# Patient Record
Sex: Female | Born: 1935 | Race: White | Hispanic: No | Smoking: Never smoker
Health system: Southern US, Community
[De-identification: ages and names within clinical notes are randomized; demographics above are authoritative.]

## PROBLEM LIST (undated history)

## (undated) DIAGNOSIS — M4800 Spinal stenosis, site unspecified: Secondary | ICD-10-CM

## (undated) DIAGNOSIS — M1990 Unspecified osteoarthritis, unspecified site: Secondary | ICD-10-CM

## (undated) DIAGNOSIS — F329 Major depressive disorder, single episode, unspecified: Secondary | ICD-10-CM

## (undated) DIAGNOSIS — I739 Peripheral vascular disease, unspecified: Secondary | ICD-10-CM

## (undated) DIAGNOSIS — J8410 Pulmonary fibrosis, unspecified: Secondary | ICD-10-CM

## (undated) DIAGNOSIS — I2510 Atherosclerotic heart disease of native coronary artery without angina pectoris: Principal | ICD-10-CM

## (undated) DIAGNOSIS — F32A Depression, unspecified: Secondary | ICD-10-CM

## (undated) DIAGNOSIS — Z9861 Coronary angioplasty status: Principal | ICD-10-CM

## (undated) DIAGNOSIS — R911 Solitary pulmonary nodule: Secondary | ICD-10-CM

## (undated) DIAGNOSIS — N183 Chronic kidney disease, stage 3 unspecified: Secondary | ICD-10-CM

## (undated) DIAGNOSIS — I10 Essential (primary) hypertension: Secondary | ICD-10-CM

## (undated) HISTORY — DX: Essential (primary) hypertension: I10

## (undated) HISTORY — DX: Peripheral vascular disease, unspecified: I73.9

## (undated) HISTORY — DX: Atherosclerotic heart disease of native coronary artery without angina pectoris: I25.10

## (undated) HISTORY — PX: BACK SURGERY: SHX140

## (undated) HISTORY — DX: Pulmonary fibrosis, unspecified: J84.10

## (undated) HISTORY — DX: Unspecified osteoarthritis, unspecified site: M19.90

## (undated) HISTORY — DX: Major depressive disorder, single episode, unspecified: F32.9

## (undated) HISTORY — PX: TUBAL LIGATION: SHX77

## (undated) HISTORY — DX: Depression, unspecified: F32.A

## (undated) HISTORY — DX: Spinal stenosis, site unspecified: M48.00

## (undated) HISTORY — DX: Coronary angioplasty status: Z98.61

## (undated) HISTORY — DX: Solitary pulmonary nodule: R91.1

## (undated) HISTORY — DX: Chronic kidney disease, stage 3 (moderate): N18.3

## (undated) HISTORY — PX: APPENDECTOMY: SHX54

## (undated) HISTORY — DX: Chronic kidney disease, stage 3 unspecified: N18.30

---

## 1999-12-22 ENCOUNTER — Encounter: Admission: RE | Admit: 1999-12-22 | Discharge: 1999-12-22 | Payer: Self-pay | Admitting: Family Medicine

## 1999-12-22 ENCOUNTER — Encounter: Payer: Self-pay | Admitting: Family Medicine

## 2001-05-11 ENCOUNTER — Encounter (INDEPENDENT_AMBULATORY_CARE_PROVIDER_SITE_OTHER): Payer: Self-pay | Admitting: Specialist

## 2001-05-11 ENCOUNTER — Ambulatory Visit (HOSPITAL_COMMUNITY): Admission: RE | Admit: 2001-05-11 | Discharge: 2001-05-11 | Payer: Self-pay | Admitting: Gastroenterology

## 2003-06-22 ENCOUNTER — Encounter: Admission: RE | Admit: 2003-06-22 | Discharge: 2003-06-22 | Payer: Self-pay | Admitting: Family Medicine

## 2008-08-02 ENCOUNTER — Encounter: Admission: RE | Admit: 2008-08-02 | Discharge: 2008-08-02 | Payer: Self-pay | Admitting: Family Medicine

## 2008-08-20 ENCOUNTER — Encounter: Admission: RE | Admit: 2008-08-20 | Discharge: 2008-08-20 | Payer: Self-pay | Admitting: Cardiology

## 2008-09-24 ENCOUNTER — Encounter: Admission: RE | Admit: 2008-09-24 | Discharge: 2008-09-24 | Payer: Self-pay | Admitting: Cardiology

## 2008-10-15 ENCOUNTER — Encounter: Admission: RE | Admit: 2008-10-15 | Discharge: 2008-10-15 | Payer: Self-pay | Admitting: Family Medicine

## 2009-04-02 ENCOUNTER — Encounter: Admission: RE | Admit: 2009-04-02 | Discharge: 2009-04-02 | Payer: Self-pay | Admitting: Family Medicine

## 2010-08-01 NOTE — Procedures (Signed)
[facility] [place]  Patient:    NOAM, FRANZEN Visit Number: [id number] MRN: [id number]          Service Type: END Location: ENDO Attending Physician:  Orvis Brill Dictated by:   Jeryl Columbia, M.D. Proc. Date: 05/11/01 Admit Date:  05/11/2001   CC:         Lindwood Qua, M.D.   Procedure Report  PROCEDURE:  Colonoscopy with biopsy.  INDICATIONS FOR PROCEDURE:  A patient with a questionable history of colon polyps, family history of colon polyps, constipation, lower abdominal pain due for colonic screening.  Consent was signed after risks, benefits, methods, and options were thoroughly discussed in the office.  MEDICINES USED:  Demerol 100, Versed 11.  DESCRIPTION OF PROCEDURE:  Rectal inspection is pertinent for external hemorrhoids, small. Digital exam was negative. The pediatric video adjustable colonoscope was inserted and with some difficulty due to a tortuous sigmoid we were able to advance to the cecum. This did require rolling her on her back and then on her right side and various abdominal pressures. Other than a left sided diverticula, no other abnormality was seen on insertion. The prep was adequate. There was some liquid stool that required washing and suctioning. The scope was inserted a short ways into the terminal ileum which was normal. Photo documentation was obtained. The scope was slowly withdrawn. On slow withdrawal through the colon, she did have a tortuous colon. When we fell back around a tortuous curve, we tried to readvance around the curve to decrease the chances of missing things. There was a small sessile ascending polyp along a fold which we went ahead and cold biopsied x 3. The scope was further withdrawn. No other polypoid lesions or masses were seen as we slowly withdrew back to the rectum. We did see some significant left sided diverticula and some tortuosity and wall edema particularly in the sigmoid. No  other abnormalities were seen. Once back in the rectum, the scope was retroflexed pertinent for some small internal hemorrhoids. The scope was straightened, air was suctioned, the scope removed. The patient tolerated the procedure fair. There was no obvious or immediate complication.  ENDOSCOPIC DIAGNOSIS:  1. Internal/external hemorrhoids.  2. Left sided diverticula and tortuosity.  3. Small ascending sessile polyp cold biopsied.  4. Otherwise within normal limits to the terminal ileum.  PLAN:  Await pathology to determine future colonic screening. Happy to see back p.r.n. or in two to three months to recheck symptoms and make sure no further workup plans are needed but would consider a CAT scan next. Possibly her lower pain is just due to adhesions which played a role with her discomfort in this procedure I believe. In the future if colonoscopy is needed probably would recommend diprivan. Dictated by:   Jeryl Columbia, M.D. Attending Physician:  Orvis Brill DD:  05/11/01 TD:  05/11/01 Job: [phone] [id number]

## 2010-08-05 ENCOUNTER — Other Ambulatory Visit: Payer: Self-pay | Admitting: Family Medicine

## 2010-08-05 DIAGNOSIS — IMO0002 Reserved for concepts with insufficient information to code with codable children: Secondary | ICD-10-CM

## 2010-08-09 ENCOUNTER — Ambulatory Visit
Admission: RE | Admit: 2010-08-09 | Discharge: 2010-08-09 | Disposition: A | Discharge status: Home / Self Care | Payer: BLUE CROSS/BLUE SHIELD | Source: Ambulatory Visit | Attending: Family Medicine | Admitting: Family Medicine

## 2010-08-09 DIAGNOSIS — IMO0002 Reserved for concepts with insufficient information to code with codable children: Secondary | ICD-10-CM

## 2012-03-21 ENCOUNTER — Other Ambulatory Visit: Payer: Self-pay | Admitting: Family Medicine

## 2012-03-21 DIAGNOSIS — J984 Other disorders of lung: Secondary | ICD-10-CM

## 2012-03-24 ENCOUNTER — Inpatient Hospital Stay: Admission: RE | Admit: 2012-03-24 | Payer: BLUE CROSS/BLUE SHIELD | Source: Ambulatory Visit

## 2012-03-29 ENCOUNTER — Ambulatory Visit
Admission: RE | Admit: 2012-03-29 | Discharge: 2012-03-29 | Disposition: A | Discharge status: Home / Self Care | Payer: Medicare Other | Source: Ambulatory Visit | Attending: Family Medicine | Admitting: Family Medicine

## 2012-03-29 DIAGNOSIS — J984 Other disorders of lung: Secondary | ICD-10-CM

## 2012-03-29 MED ORDER — IOHEXOL 300 MG/ML  SOLN
75.0000 mL | Freq: Once | INTRAMUSCULAR | Status: AC | PRN
  Administered 2012-03-29: 75 mL via INTRAVENOUS

## 2013-05-17 ENCOUNTER — Other Ambulatory Visit: Payer: Self-pay | Admitting: Family Medicine

## 2013-05-17 ENCOUNTER — Ambulatory Visit
Admission: RE | Admit: 2013-05-17 | Discharge: 2013-05-17 | Disposition: A | Discharge status: Home / Self Care | Payer: Medicare Other | Source: Ambulatory Visit | Attending: Family Medicine | Admitting: Family Medicine

## 2013-05-17 DIAGNOSIS — R0600 Dyspnea, unspecified: Secondary | ICD-10-CM

## 2013-11-29 ENCOUNTER — Encounter (INDEPENDENT_AMBULATORY_CARE_PROVIDER_SITE_OTHER): Payer: Self-pay

## 2013-11-29 ENCOUNTER — Encounter: Payer: Self-pay | Admitting: Emergency Medicine

## 2013-11-29 ENCOUNTER — Ambulatory Visit (INDEPENDENT_AMBULATORY_CARE_PROVIDER_SITE_OTHER): Payer: Medicare Other | Admitting: Emergency Medicine

## 2013-11-29 ENCOUNTER — Other Ambulatory Visit: Payer: Medicare Other

## 2013-11-29 VITALS — BP 128/70 | HR 98 | Temp 97.4°F | Ht 61.0 in | Wt 174.4 lb

## 2013-11-29 DIAGNOSIS — R0902 Hypoxemia: Secondary | ICD-10-CM | POA: Insufficient documentation

## 2013-11-29 DIAGNOSIS — J849 Interstitial pulmonary disease, unspecified: Secondary | ICD-10-CM

## 2013-11-29 DIAGNOSIS — J8410 Pulmonary fibrosis, unspecified: Secondary | ICD-10-CM

## 2013-11-29 NOTE — Assessment & Plan Note (Signed)
Etiology of her hypoxemia and her progressive dyspnea is not entirely clear. She does have interstitial lung disease but it has not changed significantly compared with 2011 by CT scan. She does have more kyphosis now which could contribute to restrictive lung disease. If she has been hypoxemic chronically and she may have secondary pulmonary hypertension. She describes chest pain with exertion and tells me that this has been worked up before. I don't see any records - echocardiogram to evaluate pulmonary pressures and LV function - Consider a full cardiology eval

## 2013-11-29 NOTE — Progress Notes (Signed)
Subjective:    Patient ID: Christine Pratt, female    DOB: 1925/09/18, 80 y.o.   MRN: [id number]  HPI 80 yo never smoker followed by Dr Brigitte Pulse for HTN, OA, CKD. Carries a dx of ILD in a UIP pattern with some mediastinal LAD first noted on a CT scan performed 10/15/08 to eval a RML pleural based nodule. The nodule has been stable on subsequent scans, last 03/2012. There may be some progression of her ILD but only slight. Has tried Advair without effect in the past. She was just started on O2, was given symbicort but hasn;t started it yet. Denies any cough. She does not have an aspiration sx.  She has exertional CP that gets better when she stops to rest.   She had normal spirometry 09/28/12.    Review of Systems  Constitutional: Negative for fever and unexpected weight change.  HENT: Positive for rhinorrhea. Negative for congestion, dental problem, ear pain, nosebleeds, postnasal drip, sinus pressure, sneezing, sore throat and trouble swallowing.   Eyes: Negative for redness and itching.  Respiratory: Positive for cough, shortness of breath and wheezing. Negative for chest tightness.   Cardiovascular: Positive for leg swelling. Negative for palpitations.  Gastrointestinal: Negative for nausea and vomiting.  Genitourinary: Negative for dysuria.  Musculoskeletal: Positive for joint swelling.  Skin: Negative for rash.  Neurological: Negative for headaches.  Hematological: Does not bruise/bleed easily.  Psychiatric/Behavioral: Negative for dysphoric mood. The patient is not nervous/anxious.       Past Medical History  Diagnosis Date  . Hypertension   . Arthritis   . Depression   . Pulmonary nodule   . DJD (degenerative joint disease)   . Spinal stenosis   . CKD (chronic kidney disease), stage III   . Pulmonary fibrosis      Family History  Problem Relation Age of Onset  . Heart failure Sister   . Cancer Mother     kidney  . Heart attack Father      History   Social History  .  Marital Status: Widowed    Spouse Name: N/A    Number of Children: N/A  . Years of Education: N/A   Occupational History  . Retired    Social History Main Topics  . Smoking status: Never Smoker   . Smokeless tobacco: Never Used  . Alcohol Use: No  . Drug Use: No  . Sexual Activity: Not on file   Other Topics Concern  . Not on file   Social History Narrative  . No narrative on file  No dust or fume exposure. Has worked as a Network engineer, as a Building control surveyor.  Unsure about mold exposure in the basement.   No Known Allergies   No outpatient prescriptions prior to visit.   No facility-administered medications prior to visit.         Objective:   Physical Exam Filed Vitals:   11/29/13 1602  BP: 128/70  Pulse: 98  Temp: 97.4 F (36.3 C)  Height: 5' 1" (1.549 m)  Weight: 174 lb 6.4 oz (79.107 kg)  SpO2: 91%   Gen: Pleasant, elderly woman, in no distress,  normal affect on O2  ENT: No lesions,  mouth clear,  oropharynx clear, no postnasal drip  Neck: No JVD, no TMG, no carotid bruits  Lungs: No use of accessory muscles, B insp squeaks and crackles all fields  Cardiovascular: RRR, heart sounds normal, no murmur or gallops, no peripheral edema  Musculoskeletal: No deformities, no cyanosis or clubbing  Neuro: alert, non focal  Skin: Warm, no lesions or rashes       Assessment & Plan:  ILD (interstitial lung disease) Please continue to wear your oxygen with all exertion said on 4 L per minute We may decide to do a swallowing test in the future Lab work today > autoimmune labs Follow with Dr Lamonte Sakai in 1 month  Hypoxemia Etiology of her hypoxemia and her progressive dyspnea is not entirely clear. She does have interstitial lung disease but it has not changed significantly compared with 2011 by CT scan. She does have more kyphosis now which could contribute to restrictive lung disease. If she has been hypoxemic chronically and she may have secondary pulmonary  hypertension. She describes chest pain with exertion and tells me that this has been worked up before. I don't see any records - echocardiogram to evaluate pulmonary pressures and LV function - Consider a full cardiology eval

## 2013-11-29 NOTE — Patient Instructions (Signed)
Please continue to wear your oxygen with all exertion said on 4 L per minute We will perform an echocardiogram Depending on your echocardiogram results and your chest discomfort we may decide to send you for a full cardiology evaluation We may decide to do a swallowing test in the future Lab work today  Follow with Dr Lamonte Sakai in 1 month

## 2013-11-29 NOTE — Assessment & Plan Note (Signed)
Please continue to wear your oxygen with all exertion said on 4 L per minute We may decide to do a swallowing test in the future Lab work today > autoimmune labs Follow with Dr Lamonte Sakai in 1 month

## 2013-11-30 LAB — ANTI-DNA ANTIBODY, DOUBLE-STRANDED: ds DNA Ab: 1 IU/mL

## 2013-11-30 LAB — ANA: Anti Nuclear Antibody(ANA): NEGATIVE

## 2013-11-30 LAB — SJOGRENS SYNDROME-A EXTRACTABLE NUCLEAR ANTIBODY: SSA (Ro) (ENA) Antibody, IgG: <1

## 2013-11-30 LAB — SJOGRENS SYNDROME-B EXTRACTABLE NUCLEAR ANTIBODY: SSB (La) (ENA) Antibody, IgG: <1

## 2013-11-30 LAB — ANTI-SCLERODERMA ANTIBODY: SCLERODERMA (SCL-70) (ENA) ANTIBODY, IGG: NEGATIVE

## 2013-11-30 LAB — RHEUMATOID FACTOR: Rhuematoid fact SerPl-aCnc: <10 IU/mL (ref <=14)

## 2013-12-06 ENCOUNTER — Ambulatory Visit (HOSPITAL_COMMUNITY): Payer: Medicare Other | Attending: Cardiology | Admitting: Radiology

## 2013-12-06 DIAGNOSIS — R072 Precordial pain: Secondary | ICD-10-CM | POA: Diagnosis not present

## 2013-12-06 DIAGNOSIS — I10 Essential (primary) hypertension: Secondary | ICD-10-CM | POA: Insufficient documentation

## 2013-12-06 DIAGNOSIS — R0902 Hypoxemia: Secondary | ICD-10-CM

## 2013-12-06 HISTORY — PX: TRANSTHORACIC ECHOCARDIOGRAM: SHX275

## 2013-12-06 NOTE — Progress Notes (Signed)
Echocardiogram performed.  

## 2013-12-29 ENCOUNTER — Encounter: Payer: Self-pay | Admitting: Emergency Medicine

## 2013-12-29 ENCOUNTER — Ambulatory Visit (INDEPENDENT_AMBULATORY_CARE_PROVIDER_SITE_OTHER): Payer: Medicare Other | Admitting: Emergency Medicine

## 2013-12-29 VITALS — BP 142/78 | HR 80 | Temp 97.4°F | Ht 61.0 in | Wt 177.2 lb

## 2013-12-29 DIAGNOSIS — J849 Interstitial pulmonary disease, unspecified: Secondary | ICD-10-CM

## 2013-12-29 DIAGNOSIS — I209 Angina pectoris, unspecified: Secondary | ICD-10-CM | POA: Insufficient documentation

## 2013-12-29 DIAGNOSIS — R0789 Other chest pain: Secondary | ICD-10-CM

## 2013-12-29 DIAGNOSIS — R079 Chest pain, unspecified: Secondary | ICD-10-CM

## 2013-12-29 NOTE — Patient Instructions (Signed)
Please continue wearing your oxygen at all times.  Try using Symbicort 2 puffs twice a day until our next visit.  We will refer you to see a cardiologist to further evaluate your exercise chest pain and shortness of breath.  We will consider checking a swallowing study to see if aspiration is contributing to the scar in your lungs.  Follow with Dr Lamonte Sakai in 2 months or sooner if you have any problems.

## 2013-12-29 NOTE — Assessment & Plan Note (Signed)
Etiology unclear but reliably happens with exertion. Need to consider CAD, although TTE was reassuring. Will refer her for a Cards eval .

## 2013-12-29 NOTE — Progress Notes (Signed)
Subjective:    Patient ID: Christine Pratt, female    DOB: 12/12/1925, 80 y.o.   MRN: [id number]  HPI 80 yo never smoker followed by Dr Brigitte Pulse for HTN, OA, CKD. Carries a dx of ILD in a UIP pattern with some mediastinal LAD first noted on a CT scan performed 10/15/08 to eval a RML pleural based nodule. The nodule has been stable on subsequent scans, last 03/2012. There may be some progression of her ILD but only slight. Has tried Advair without effect in the past. She was just started on O2, was given symbicort but hasn;t started it yet. Denies any cough. She does not have an aspiration sx.  She has exertional CP that gets better when she stops to rest.   She had normal spirometry 09/28/12.   ROV 12/29/13 -- f/u visit for ILD and dyspnea. She underwent TTE > normal LV fxn and PAP. Also autoimmune labs > all normal.  She describes exertional SOB and CP, not with walking but with heavier exertion. No AFL on her spirometry, but she believes that Symbicort may help her a bit - she has not used reliably.    Review of Systems  Constitutional: Negative for fever and unexpected weight change.  HENT: Positive for rhinorrhea. Negative for congestion, dental problem, ear pain, nosebleeds, postnasal drip, sinus pressure, sneezing, sore throat and trouble swallowing.   Eyes: Negative for redness and itching.  Respiratory: Positive for cough, shortness of breath and wheezing. Negative for chest tightness.   Cardiovascular: Positive for leg swelling. Negative for palpitations.  Gastrointestinal: Negative for nausea and vomiting.  Genitourinary: Negative for dysuria.  Musculoskeletal: Positive for joint swelling.  Skin: Negative for rash.  Neurological: Negative for headaches.  Hematological: Does not bruise/bleed easily.  Psychiatric/Behavioral: Negative for dysphoric mood. The patient is not nervous/anxious.          Objective:   Physical Exam Filed Vitals:   12/29/13 1549  BP: 142/78  Pulse: 80  Temp:  97.4 F (36.3 C)  TempSrc: Oral  Height: 5' 1" (1.549 m)  Weight: 177 lb 3.2 oz (80.377 kg)  SpO2: 98%   Gen: Pleasant, elderly woman, in no distress,  normal affect on O2  ENT: No lesions,  mouth clear,  oropharynx clear, no postnasal drip  Neck: No JVD, no TMG, no carotid bruits  Lungs: No use of accessory muscles, B insp squeaks and crackles all fields  Cardiovascular: RRR, heart sounds normal, no murmur or gallops, no peripheral edema  Musculoskeletal: No deformities, no cyanosis or clubbing  Neuro: alert, non focal  Skin: Warm, no lesions or rashes       Assessment & Plan:  ILD (interstitial lung disease) Please continue wearing your oxygen at all times.  Try using Symbicort 2 puffs twice a day until our next visit.  We will consider checking a swallowing study to see if aspiration is contributing to the scar in your lungs.  Follow with Dr Lamonte Sakai in 2 months or sooner if you have any problems.  Chest pain Etiology unclear but reliably happens with exertion. Need to consider CAD, although TTE was reassuring. Will refer her for a Cards eval .

## 2013-12-29 NOTE — Assessment & Plan Note (Signed)
Please continue wearing your oxygen at all times.  Try using Symbicort 2 puffs twice a day until our next visit.  We will consider checking a swallowing study to see if aspiration is contributing to the scar in your lungs.  Follow with Dr Lamonte Sakai in 2 months or sooner if you have any problems.

## 2014-01-05 ENCOUNTER — Encounter: Payer: Self-pay | Admitting: Cardiology

## 2014-01-05 ENCOUNTER — Ambulatory Visit (INDEPENDENT_AMBULATORY_CARE_PROVIDER_SITE_OTHER): Payer: Medicare Other | Admitting: Cardiology

## 2014-01-05 VITALS — BP 140/66 | HR 75 | Ht 61.0 in | Wt 172.3 lb

## 2014-01-05 DIAGNOSIS — R0902 Hypoxemia: Secondary | ICD-10-CM

## 2014-01-05 DIAGNOSIS — I10 Essential (primary) hypertension: Secondary | ICD-10-CM

## 2014-01-05 DIAGNOSIS — R0609 Other forms of dyspnea: Secondary | ICD-10-CM

## 2014-01-05 DIAGNOSIS — R079 Chest pain, unspecified: Secondary | ICD-10-CM

## 2014-01-05 NOTE — Patient Instructions (Signed)
PLEASE CALL OFFICE ON Monday WITH YOUR DECISION - ASK FOR Trixie Dredge RN  Your physician has requested that you have a lexiscan myoview. For further information please visit [email or web address]. Please follow instruction sheet, as given.  Your physician has requested that you have a cardiac catheterization. Cardiac catheterization is used to diagnose and/or treat various heart conditions. Doctors may recommend this procedure for a number of different reasons. The most common reason is to evaluate chest pain. Chest pain can be a symptom of coronary artery disease (CAD), and cardiac catheterization can show whether plaque is narrowing or blocking your heart's arteries. This procedure is also used to evaluate the valves, as well as measure the blood flow and oxygen levels in different parts of your heart. For further information please visit [email or web address]. Please follow instruction sheet, as given.

## 2014-01-07 ENCOUNTER — Encounter: Payer: Self-pay | Admitting: Cardiology

## 2014-01-07 DIAGNOSIS — E669 Obesity, unspecified: Secondary | ICD-10-CM | POA: Insufficient documentation

## 2014-01-07 DIAGNOSIS — I10 Essential (primary) hypertension: Secondary | ICD-10-CM | POA: Insufficient documentation

## 2014-01-07 DIAGNOSIS — R0609 Other forms of dyspnea: Secondary | ICD-10-CM

## 2014-01-07 NOTE — Assessment & Plan Note (Signed)
Borderline elevated pressures today. She has had relatively normal pressures in the past of her age I would not be overly aggressive. She does seem to have heart rate Beta1selective Beta Blocker if necessary.

## 2014-01-07 NOTE — Progress Notes (Signed)
PATIENTJosepha Pratt MRN: [id number] DOB: 03/28/1925 PCP: Mayra Neer, MD  Clinic Note: Chief Complaint  Patient presents with  . New Evaluation    referred by pulm -Dr Lamonte Sakai ,chest discomfort with exertion , sob ,  edema off/ on aswell as face, use Oxygen 4 liter  nasal cannula,     HPI: Christine Pratt is a 80 y.o. female with a PMH below who presents today for evaluation of exertional chest pain /discomfort with profound dyspnea. She was referred by her pulmonologist whom she sees for her interstitial lung disease (pulmonary fibrosis).  Interval History: Thumb is very pleasant woman who has been now on home oxygen for a couple weeks. She has noted now progressively worsening exertional dyspnea to the point where she is on 4 L of oxygen to see at rest and has profound dyspnea simply walking from the waiting room to the clinic room. She is also started noticing chest pressure type sensation that only occurs with exertion not at rest. The sling on for maybe 2-3 months and is getting worse. She describes as a heavy feeling in her chest -- like a band across her chest. It doesn't go anywhere the rest. In addition to feeling short of breath when she does any activity she just feels like she tires quite easily.  Cardiovascular ROS: positive for - dyspnea on exertion, edema, shortness of breath and Exertional chest discomfort and fatigue negative for - irregular heartbeat, loss of consciousness, murmur, orthopnea, palpitations, paroxysmal nocturnal dyspnea, rapid heart rate or Syncope/near syncope, TIA/amaurosis fugax. :  Past Medical History  Diagnosis Date  . Hypertension   . Arthritis   . Depression   . Pulmonary nodule   . DJD (degenerative joint disease)   . Spinal stenosis   . CKD (chronic kidney disease), stage III   . Pulmonary fibrosis     Prior Cardiac Evaluation and Past Surgical History: Past Surgical History  Procedure Laterality Date  . Tubal ligation    . Appendectomy      . Back surgery    . Transthoracic echocardiogram  12/06/2013     Mild LVH. Vigorous function with EF 65-70%. No regional WMA, high LV filling pressures, MAC with mld MR.    No Known Allergies  Current Outpatient Prescriptions  Medication Sig Dispense Refill  . amLODipine (NORVASC) 10 MG tablet Take 1 tablet by mouth daily.      Marland Kitchen b complex vitamins tablet Take 1 tablet by mouth daily.      . Cholecalciferol (VITAMIN D3) 2000 UNITS TABS Take 1 tablet by mouth daily.      Marland Kitchen escitalopram (LEXAPRO) 20 MG tablet Take 1 tablet by mouth daily.      . furosemide (LASIX) 20 MG tablet Take 1 tablet by mouth daily.      Marland Kitchen LORazepam (ATIVAN) 1 MG tablet Take 1 tablet by mouth daily.      . Omega-3 Fatty Acids (FISH OIL) 1000 MG CAPS Take 1 capsule by mouth 2 (two) times daily.      . Oxycodone HCl 20 MG TABS Take 1 tablet by mouth 3 (three) times daily.      . Polyethylene Glycol 3350 (MIRALAX PO) Take by mouth daily as needed.      . quinapril (ACCUPRIL) 20 MG tablet Take 1 tablet by mouth daily.      . vitamin C (ASCORBIC ACID) 500 MG tablet Take 500 mg by mouth daily.       No current facility-administered medications for  this visit.    History   Social History Narrative   Retired. Widowed.   Never smoked and does not drink alcohol.    family history includes Cancer in her mother; Heart attack in her father; Heart disease in her brother; Heart failure in her sister.  ROS: A comprehensive Review of Systems - was performed \Review of Systems  Constitutional: Positive for malaise/fatigue. Negative for weight loss.  HENT: Negative for congestion and nosebleeds.   Respiratory: Positive for cough, shortness of breath and wheezing. Negative for hemoptysis and sputum production.   Cardiovascular: Negative for claudication.       Per history of present illness  Gastrointestinal: Negative for heartburn, nausea, abdominal pain, constipation, blood in stool and melena.  Genitourinary: Negative  for hematuria and flank pain.  Musculoskeletal: Positive for back pain and joint pain.       And swelling OA; Is on pain medications for chronic low back pain -upper lumbar   Neurological: Negative for dizziness, sensory change, speech change, focal weakness, seizures, loss of consciousness and headaches.  Endo/Heme/Allergies: Bruises/bleeds easily.  Psychiatric/Behavioral: Negative for depression.  All other systems reviewed and are negative.   PHYSICAL EXAM BP 140/66  Pulse 75  Ht 5' 1" (1.549 m)  Wt 172 lb 4.8 oz (78.155 kg)  BMI 32.57 kg/m2 Physical Exam  Constitutional: She appears well-developed and well-nourished. No distress.  HENT:  Head: Normocephalic and atraumatic.  Mouth/Throat: Oropharynx is clear and moist. No oropharyngeal exudate.  Eyes: Conjunctivae and EOM are normal. Pupils are equal, round, and reactive to light. No scleral icterus.  Neck: Normal range of motion. Neck supple. No JVD present. No tracheal deviation present. No thyromegaly present.  Cardiovascular: Normal rate, regular rhythm and normal heart sounds.  Exam reveals no gallop and no friction rub.   No murmur heard. Pulmonary/Chest: Effort normal. No stridor. No respiratory distress. She has wheezes. She has no rales. She exhibits no tenderness.  On home oxygen at 2 L .  Late inspiratory wheezing bilaterally but left >right  Abdominal: Soft. Bowel sounds are normal. She exhibits no distension and no mass. There is no tenderness. There is no rebound and no guarding.  Genitourinary:  Deferred  Skin: Skin is warm and dry. No erythema.  Psychiatric: She has a normal mood and affect. Her behavior is normal. Thought content normal.  A bit nervous, and unsure of her judgment     Adult ECG Report  Rate: 75 ;  Rhythm: normal sinus rhythm  QRS Axis: 60 ;  PR Interval: 198 ;  QRS Duration: 78 ; QTc: 444,  Voltages: Normal  Conduction Disturbances: Borderline first-degree AV block  Other Abnormalities:  none   Narrative Interpretation: Essentially normal EKG  Recent Labs: No recent labs  ASSESSMENT / PLAN: Very concerning story with an 76 year old woman who has hypertension and pulmonary fibrosis presenting with worsening exertional dyspnea and chest pressure. While certainly this could be related to her interstitial lung disease, I am concerned that the chest discomfort may be more consistent with coronary ischemia.  I had a lengthy discussion with the patient, who was referred to him as the advice of her family (daughter). At this point I think we have to potential options for evaluating her symptoms:  1. Invasive approach with direct cardiac catheterization which would include right and left heart catheterization given her lung disease to evaluate potential and extent of pulmonary hypertension. 2. Noninvasive approach which would consist of a Lexiscan Myoview in addition to the  Echocardiogram has been performed.  The fact that she is elevated LVEDP on echocardiogram is suggestive of a higher level of diastolic dysfunction which could contribute to dyspnea. In the absence of significant LVH or hypertension, one would have to presume that is a possibility of some ischemic diastolic dysfunction.  For now, we will plan to order a LexiScan Myoview. She is going to spend the weekend discussing these options with her daughter and family members. Hopefully by next week we will know what her decision is. If she chooses to forego noninvasive evaluation I think the nature of her progressively worsening symptoms is concerning for unstable process and I do worry about the possibility of multivessel disease in this elderly woman.  Chest pain with moderate risk for cardiac etiology I agree with Dr. Lamonte Sakai that we need to consider CAD as a highly likely possibility. In this situation her chest discomfort would be considered at least Class III-IV Angina based on how much her symptoms are limited. She is on  amlodipine already on an ACE inhibitor. No beta blocker due to her pulmonary disease.  She is also on aspirin although not listed on her medication list above.   Hypoxemia Echocardiogram did not necessarily suggest elevated pulmonary pressures, however there was evidence of LVEDP elevation. This may be more consistent with possible left heart involving the posterior right heart moment for pulmonary hypertension. She only has mild edema but no PND orthopnea so involving heart failure is true etiology, making pulmonary venous congestion/hypertension as likely option to explain her oxygen requirement. The exertional dyspnea in the presence of chest discomfort component however is concerning for coronary ischemia.  If we do proceed with cardiac catheterization, moderate elevation of the right and left heart catheterization.  Essential hypertension Borderline elevated pressures today. She has had relatively normal pressures in the past of her age I would not be overly aggressive. She does seem to have heart rate Beta1selective Beta Blocker if necessary.  DOE (dyspnea on exertion) Certainly with her baseline oxygen requirement, even mild exertion may exacerbate her hypoxia and dyspnea. The presence of elevated LVEDP does increase the potential for ischemic etiology, however her interstitial lung disease tends to cloud the issue.     Orders Placed This Encounter  Procedures  . Myocardial Perfusion Imaging    Standing Status: Future     Number of Occurrences:      Standing Expiration Date: 01/05/2015    Order Specific Question:  Where should this test be performed    Answer:  [facility]    Order Specific Question:  Type of stress    Answer:  Lexiscan    Order Specific Question:  Patient weight in lbs    Answer:  172  . EKG 12-Lead  . LEFT AND RIGHT HEART CATHETERIZATION WITH CORONARY ANGIOGRAM   No orders of the defined types were placed in this encounter.    Followup: 2-3  weeks  Kent Riendeau W, M.D., M.S. Interventional Cardiologist   Pager # [phone]

## 2014-01-07 NOTE — Assessment & Plan Note (Signed)
Echocardiogram did not necessarily suggest elevated pulmonary pressures, however there was evidence of LVEDP elevation. This may be more consistent with possible left heart involving the posterior right heart moment for pulmonary hypertension. She only has mild edema but no PND orthopnea so involving heart failure is true etiology, making pulmonary venous congestion/hypertension as likely option to explain her oxygen requirement. The exertional dyspnea in the presence of chest discomfort component however is concerning for coronary ischemia.  If we do proceed with cardiac catheterization, moderate elevation of the right and left heart catheterization.

## 2014-01-07 NOTE — Assessment & Plan Note (Signed)
I agree with Dr. Lamonte Sakai that we need to consider CAD as a highly likely possibility. In this situation her chest discomfort would be considered at least Class III-IV Angina based on how much her symptoms are limited. She is on amlodipine already on an ACE inhibitor. No beta blocker due to her pulmonary disease.  She is also on aspirin although not listed on her medication list above.

## 2014-01-07 NOTE — Assessment & Plan Note (Signed)
Certainly with her baseline oxygen requirement, even mild exertion may exacerbate her hypoxia and dyspnea. The presence of elevated LVEDP does increase the potential for ischemic etiology, however her interstitial lung disease tends to cloud the issue.

## 2014-01-08 ENCOUNTER — Telehealth: Payer: Self-pay | Admitting: Cardiology

## 2014-01-08 ENCOUNTER — Encounter: Payer: Self-pay | Admitting: Cardiology

## 2014-01-08 ENCOUNTER — Encounter (HOSPITAL_COMMUNITY): Payer: Self-pay | Admitting: Pharmacy Technician

## 2014-01-08 DIAGNOSIS — R0609 Other forms of dyspnea: Principal | ICD-10-CM

## 2014-01-08 DIAGNOSIS — D689 Coagulation defect, unspecified: Secondary | ICD-10-CM

## 2014-01-08 DIAGNOSIS — Z01818 Encounter for other preprocedural examination: Secondary | ICD-10-CM

## 2014-01-08 DIAGNOSIS — R0902 Hypoxemia: Secondary | ICD-10-CM

## 2014-01-08 DIAGNOSIS — R079 Chest pain, unspecified: Secondary | ICD-10-CM

## 2014-01-08 NOTE — Telephone Encounter (Signed)
PATIENT RETURNED CALL  SHE DECIDE TO HAVE LEFT AND RIGHT HEART CATH OFFERED FOR PROCEDURE TO BE DONE THIS WEEK. PATIENT WOULD LIKE TO SCHEDULE FOR THE WEEK OF NOV 2. RN INSTRUCTED HER TO HAVE LABS COMPLETED BY 02/11/14 SCHEDULER WILL CALL DATE AND TIME  LABS ORDER- PATIENT WILL GO TO [facility] -NO XRAY IS NEEDED

## 2014-01-08 NOTE — Telephone Encounter (Signed)
Sounds good - I think this is best plan.  [place]

## 2014-01-08 NOTE — Telephone Encounter (Signed)
Please call,she was suppose to call back and giver you an answer.

## 2014-01-10 LAB — BASIC METABOLIC PANEL
BUN: 14 mg/dL (ref 6–23)
CHLORIDE: 97 meq/L (ref 96–112)
CO2: 26 meq/L (ref 19–32)
Calcium: 9.4 mg/dL (ref 8.4–10.5)
Creat: 1.07 mg/dL (ref 0.50–1.10)
GLUCOSE: 130 mg/dL — AB (ref 70–99)
POTASSIUM: 4.6 meq/L (ref 3.5–5.3)
SODIUM: 136 meq/L (ref 135–145)

## 2014-01-10 LAB — CBC
HCT: 43 % (ref 36.0–46.0)
HEMOGLOBIN: 14.3 g/dL (ref 12.0–15.0)
MCH: 29.7 pg (ref 26.0–34.0)
MCHC: 33.3 g/dL (ref 30.0–36.0)
MCV: 89.4 fL (ref 78.0–100.0)
Platelets: 257 10*3/uL (ref 150–400)
RBC: 4.81 MIL/uL (ref 3.87–5.11)
RDW: 14.7 % (ref 11.5–15.5)
WBC: 6.1 10*3/uL (ref 4.0–10.5)

## 2014-01-10 LAB — PROTIME-INR
INR: 1.01 (ref <1.50)
PROTHROMBIN TIME: 13.3 s (ref 11.6–15.2)

## 2014-01-10 LAB — APTT: aPTT: 28 seconds (ref 24–37)

## 2014-01-10 LAB — TSH: TSH: 1.922 u[IU]/mL (ref 0.350–4.500)

## 2014-01-12 ENCOUNTER — Telehealth: Payer: Self-pay | Admitting: *Deleted

## 2014-01-12 ENCOUNTER — Other Ambulatory Visit: Payer: Self-pay | Admitting: *Deleted

## 2014-01-12 DIAGNOSIS — Z01818 Encounter for other preprocedural examination: Secondary | ICD-10-CM

## 2014-01-12 NOTE — Telephone Encounter (Signed)
Message copied by Raiford Simmonds on Fri Jan 12, 2014  1:01 PM ------      Message from: Leonie Man      Created: Thu Jan 11, 2014  6:31 PM       Labs are okay for cardiac cath. The only thing that's abnormal is elevated blood sugars.            Leonie Man, MD       ------

## 2014-01-12 NOTE — Telephone Encounter (Signed)
Spoke to patient. Result given . Verbalized understanding RN answered question concerning cath  Verbalized understanding.

## 2014-01-16 ENCOUNTER — Ambulatory Visit (HOSPITAL_COMMUNITY)
Admission: RE | Admit: 2014-01-16 | Discharge: 2014-01-17 | Disposition: A | Discharge status: Home / Self Care | Payer: Medicare Other | Source: Ambulatory Visit | Attending: Cardiology | Admitting: Cardiology

## 2014-01-16 ENCOUNTER — Encounter (HOSPITAL_COMMUNITY)
Admission: RE | Disposition: A | Discharge status: Home / Self Care | Payer: Self-pay | Source: Ambulatory Visit | Attending: Cardiology

## 2014-01-16 ENCOUNTER — Encounter: Payer: Self-pay | Admitting: Cardiology

## 2014-01-16 DIAGNOSIS — R0902 Hypoxemia: Secondary | ICD-10-CM | POA: Diagnosis present

## 2014-01-16 DIAGNOSIS — N183 Chronic kidney disease, stage 3 (moderate): Secondary | ICD-10-CM | POA: Insufficient documentation

## 2014-01-16 DIAGNOSIS — I129 Hypertensive chronic kidney disease with stage 1 through stage 4 chronic kidney disease, or unspecified chronic kidney disease: Secondary | ICD-10-CM | POA: Insufficient documentation

## 2014-01-16 DIAGNOSIS — J849 Interstitial pulmonary disease, unspecified: Secondary | ICD-10-CM | POA: Diagnosis present

## 2014-01-16 DIAGNOSIS — E785 Hyperlipidemia, unspecified: Secondary | ICD-10-CM | POA: Diagnosis not present

## 2014-01-16 DIAGNOSIS — I209 Angina pectoris, unspecified: Secondary | ICD-10-CM | POA: Diagnosis present

## 2014-01-16 DIAGNOSIS — R0789 Other chest pain: Secondary | ICD-10-CM | POA: Diagnosis present

## 2014-01-16 DIAGNOSIS — I10 Essential (primary) hypertension: Secondary | ICD-10-CM | POA: Diagnosis not present

## 2014-01-16 DIAGNOSIS — Z9981 Dependence on supplemental oxygen: Secondary | ICD-10-CM | POA: Insufficient documentation

## 2014-01-16 DIAGNOSIS — R0609 Other forms of dyspnea: Secondary | ICD-10-CM | POA: Diagnosis present

## 2014-01-16 DIAGNOSIS — I2510 Atherosclerotic heart disease of native coronary artery without angina pectoris: Secondary | ICD-10-CM

## 2014-01-16 DIAGNOSIS — Z9861 Coronary angioplasty status: Secondary | ICD-10-CM

## 2014-01-16 DIAGNOSIS — Z01818 Encounter for other preprocedural examination: Secondary | ICD-10-CM

## 2014-01-16 DIAGNOSIS — Z955 Presence of coronary angioplasty implant and graft: Secondary | ICD-10-CM

## 2014-01-16 DIAGNOSIS — I25119 Atherosclerotic heart disease of native coronary artery with unspecified angina pectoris: Secondary | ICD-10-CM | POA: Insufficient documentation

## 2014-01-16 DIAGNOSIS — J8410 Pulmonary fibrosis, unspecified: Secondary | ICD-10-CM | POA: Diagnosis not present

## 2014-01-16 HISTORY — PX: LEFT AND RIGHT HEART CATHETERIZATION WITH CORONARY ANGIOGRAM: SHX5449

## 2014-01-16 HISTORY — DX: Atherosclerotic heart disease of native coronary artery without angina pectoris: I25.10

## 2014-01-16 HISTORY — PX: PERCUTANEOUS CORONARY STENT INTERVENTION (PCI-S): SHX6016

## 2014-01-16 HISTORY — DX: Coronary angioplasty status: Z98.61

## 2014-01-16 LAB — POCT I-STAT 3, ART BLOOD GAS (G3+)
ACID-BASE EXCESS: 2 mmol/L (ref 0.0–2.0)
Bicarbonate: 25.9 mEq/L — ABNORMAL HIGH (ref 20.0–24.0)
O2 Saturation: 99 %
TCO2: 27 mmol/L (ref 0–100)
pCO2 arterial: 36.5 mmHg (ref 35.0–45.0)
pH, Arterial: 7.458 — ABNORMAL HIGH (ref 7.350–7.450)
pO2, Arterial: 109 mmHg — ABNORMAL HIGH (ref 80.0–100.0)

## 2014-01-16 LAB — POCT I-STAT 3, VENOUS BLOOD GAS (G3P V)
Acid-Base Excess: 2 mmol/L (ref 0.0–2.0)
Bicarbonate: 26.6 mEq/L — ABNORMAL HIGH (ref 20.0–24.0)
O2 SAT: 66 %
TCO2: 28 mmol/L (ref 0–100)
pCO2, Ven: 42.6 mmHg — ABNORMAL LOW (ref 45.0–50.0)
pH, Ven: 7.403 — ABNORMAL HIGH (ref 7.250–7.300)
pO2, Ven: 34 mmHg (ref 30.0–45.0)

## 2014-01-16 LAB — POCT ACTIVATED CLOTTING TIME
ACTIVATED CLOTTING TIME: 214 s
ACTIVATED CLOTTING TIME: 264 s
ACTIVATED CLOTTING TIME: 467 s

## 2014-01-16 SURGERY — LEFT AND RIGHT HEART CATHETERIZATION WITH CORONARY ANGIOGRAM
Anesthesia: LOCAL

## 2014-01-16 MED ORDER — HEPARIN (PORCINE) IN NACL 2-0.9 UNIT/ML-% IJ SOLN
INTRAMUSCULAR | Status: AC
  Filled 2014-01-16: qty 1000

## 2014-01-16 MED ORDER — TICAGRELOR 90 MG PO TABS
90.0000 mg | ORAL_TABLET | Freq: Two times a day (BID) | ORAL | Status: DC
  Administered 2014-01-17: 90 mg via ORAL
  Filled 2014-01-16 (×3): qty 1

## 2014-01-16 MED ORDER — SODIUM CHLORIDE 0.9 % IV SOLN: 250.0000 mL | INTRAVENOUS | Status: DC | PRN

## 2014-01-16 MED ORDER — ACETAMINOPHEN 325 MG PO TABS: 650.0000 mg | ORAL_TABLET | ORAL | Status: DC | PRN

## 2014-01-16 MED ORDER — SODIUM CHLORIDE 0.9 % IJ SOLN: 3.0000 mL | Freq: Two times a day (BID) | INTRAMUSCULAR | Status: DC

## 2014-01-16 MED ORDER — TICAGRELOR 90 MG PO TABS
ORAL_TABLET | ORAL | Status: AC
  Filled 2014-01-16: qty 1

## 2014-01-16 MED ORDER — VERAPAMIL HCL 2.5 MG/ML IV SOLN
INTRAVENOUS | Status: AC
  Filled 2014-01-16: qty 2

## 2014-01-16 MED ORDER — QUINAPRIL HCL 10 MG PO TABS
20.0000 mg | ORAL_TABLET | Freq: Every day | ORAL | Status: DC
  Administered 2014-01-17: 20 mg via ORAL
  Filled 2014-01-16: qty 2

## 2014-01-16 MED ORDER — ONDANSETRON HCL 4 MG/2ML IJ SOLN: 4.0000 mg | Freq: Four times a day (QID) | INTRAMUSCULAR | Status: DC | PRN

## 2014-01-16 MED ORDER — ASPIRIN 325 MG PO TABS
ORAL_TABLET | ORAL | Status: AC
  Filled 2014-01-16: qty 1

## 2014-01-16 MED ORDER — SODIUM CHLORIDE 0.9 % IV SOLN: 1.0000 mL/kg/h | INTRAVENOUS | Status: AC

## 2014-01-16 MED ORDER — OXYCODONE HCL 20 MG PO TABS: 20.0000 mg | ORAL_TABLET | Freq: Three times a day (TID) | ORAL | Status: DC

## 2014-01-16 MED ORDER — NITROGLYCERIN 1 MG/10 ML FOR IR/CATH LAB
INTRA_ARTERIAL | Status: AC
  Filled 2014-01-16: qty 10

## 2014-01-16 MED ORDER — ASPIRIN 81 MG PO CHEW
81.0000 mg | CHEWABLE_TABLET | Freq: Every day | ORAL | Status: DC
  Administered 2014-01-17: 10:00:00 81 mg via ORAL
  Filled 2014-01-16: qty 1

## 2014-01-16 MED ORDER — ESCITALOPRAM OXALATE 20 MG PO TABS
20.0000 mg | ORAL_TABLET | Freq: Every day | ORAL | Status: DC
  Administered 2014-01-16 – 2014-01-17 (×2): 20 mg via ORAL
  Filled 2014-01-16 (×2): qty 1

## 2014-01-16 MED ORDER — HEPARIN SODIUM (PORCINE) 1000 UNIT/ML IJ SOLN
INTRAMUSCULAR | Status: AC
  Filled 2014-01-16: qty 1

## 2014-01-16 MED ORDER — LIDOCAINE HCL (PF) 1 % IJ SOLN
INTRAMUSCULAR | Status: AC
  Filled 2014-01-16: qty 30

## 2014-01-16 MED ORDER — TIROFIBAN HCL IV 5 MG/100ML
INTRAVENOUS | Status: AC
  Filled 2014-01-16: qty 100

## 2014-01-16 MED ORDER — ADENOSINE 12 MG/4ML IV SOLN
16.0000 mL | Freq: Once | INTRAVENOUS | Status: DC
  Filled 2014-01-16: qty 16

## 2014-01-16 MED ORDER — OXYCODONE HCL 5 MG PO TABS
20.0000 mg | ORAL_TABLET | Freq: Three times a day (TID) | ORAL | Status: DC
  Administered 2014-01-16 – 2014-01-17 (×2): 20 mg via ORAL
  Filled 2014-01-16 (×2): qty 4

## 2014-01-16 MED ORDER — SODIUM CHLORIDE 0.9 % IV SOLN
INTRAVENOUS | Status: DC
  Administered 2014-01-16: 12:00:00 via INTRAVENOUS

## 2014-01-16 MED ORDER — FENTANYL CITRATE 0.05 MG/ML IJ SOLN
INTRAMUSCULAR | Status: AC
  Filled 2014-01-16: qty 2

## 2014-01-16 MED ORDER — AMLODIPINE BESYLATE 10 MG PO TABS
10.0000 mg | ORAL_TABLET | Freq: Every day | ORAL | Status: DC
  Administered 2014-01-16 – 2014-01-17 (×2): 10 mg via ORAL
  Filled 2014-01-16 (×2): qty 1

## 2014-01-16 MED ORDER — MIDAZOLAM HCL 2 MG/2ML IJ SOLN
INTRAMUSCULAR | Status: AC
  Filled 2014-01-16: qty 2

## 2014-01-16 MED ORDER — MORPHINE SULFATE 2 MG/ML IJ SOLN: 2.0000 mg | INTRAMUSCULAR | Status: DC | PRN

## 2014-01-16 MED ORDER — SODIUM CHLORIDE 0.9 % IJ SOLN
3.0000 mL | INTRAMUSCULAR | Status: DC | PRN
Start: 2014-01-16 — End: 2014-01-16

## 2014-01-16 NOTE — CV Procedure (Signed)
CARDIAC CATHETERIZATION AND PERCUTANEOUS CORONARY INTERVENTION REPORT  NAME:  Christine Pratt   MRN: [id number] DOB:  1925-07-10   ADMIT DATE: 01/16/2014 Procedure Date: 01/16/2014  INTERVENTIONAL CARDIOLOGIST: Leonie Man, M.D., MS PRIMARY CARE PROVIDER: Mayra Neer, MD PRIMARY CARDIOLOGIST: Leonie Man, MD, MS  PATIENT:  Christine Pratt is a 80 y.o. femalewith a history of pulmonary fibrosis, hypertension and hyperlipidemia who was referred to me for evaluation of exertional dyspnea. On my evaluation she also noted having exertional chest tightness and pressure that is concerning for class III angina. Based on her symptoms we discussed the potential of either doing a stress test or cardiac catheterization with a right left heart cath to evaluate the potential effect of pulmonary hypertension from pulmonary fibrosis. Initially she indicated desire to do noninvasive testing, however on reevaluation she changed her mind and agreed to proceed with cardiac catheterization.  PRE-OPERATIVE DIAGNOSIS:    CLASS III ANGINA  DYSPNEA ON EXERTION  PROCEDURES PERFORMED:    Right & Left Heart Catheterization with Native Coronary Angiography  via RIGHT RADAIL Artery & RIGHT BRACHIAL Vein Access.  FRACTIONAL FLOW RESERVE (FFR) MEASUREMENT OF THE MID & PROXIMAL LAD  PERCUTANEOUS CORONARY INTERVENTION OF THE MID LAD `70-80% PRE-ADENOSINE FFR POSITIVE LESION -- XIENCE ALPINE DES 2.25 MM X 18 MM --> 2.4 MM.  PROCEDURE: The patient was brought to the [facility] in the fasting state and prepped and draped in the usual sterile fashion for RIGHT RADIAL ARTERY & BRACHIAL VEIN access. A modified Allen's test was performed on the RIGHT wrist demonstrating excellent collateral flow. Sterile technique was used including antiseptics, cap, gloves, gown, hand hygiene, mask and sheet. Skin prep: Chlorhexidine.   Consent: Risks of procedure as well as the alternatives and  risks of each were explained to the (patient/caregiver). Consent for procedure obtained.   Time Out: Verified patient identification, verified procedure, site/side was marked, verified correct patient position, special equipment/implants available, medications/allergies/relevent history reviewed, required imaging and test results available. Performed.  Access:  RIGHT Radial Artery: 6 Fr sheath -- Seldinger technique using Angiocath Micropuncture Kit 10 mL radial cocktail IA; 4000 Units IV Heparin Right Brachial/Antecubital Vein: The existing 18-gauge IV was exchanged over a wire for a 5Fr short sheath  Right Heart Catheterization: 5 Fr Gordy Councilman catheter advanced under fluoroscopy with balloon inflated to the RA, RV, then PCWP-PA for hemodynamic measurement.  Simultaneous FA & PA blood gases checked for SaO2% to calculate FICK CO/CI  Catheter removed completely out of the body with balloon deflated.  Left Heart Catheterization: 5Fr Catheters advanced or exchanged over a long-exchange safety J-wire; TIG 4.0 catheter advanced first.  LV Hemodynamics (LV Gram): TIG 4.0 RIGHT Coronary Artery Cineangiography: TIG 4.0 Catheter  LEFT Coronary Artery Cineangiography: EZ RAD L Catheter   Brachial Venous Sheath removed in the POST-PROCEDURE UNIT with manual pressure for hemostasis.  VASC band:  1750 hours; 20 mL   FINDINGS:  Hemodynamics:   SaO2%  Pressures mmHg  Mean P  mmHg  EDP  mmHg   Right Atrium   8/6  7  Right Ventricle   48/6  11   Pulmonary Artery  66 40/18  27   PCWP   14/13 12    Central Aortic  99 121/64 91   Left Ventricle   122/6  8         Cardiac Output:   Cardiac Index:    Fick  3.67  2.07  Left Ventriculography: deferred  Coronary Anatomy:  Right coronary dominant  Left Main: very large caliber vessel that tapers distally where it bifurcates into the LAD and Circumflex both of which are relatively small in diameter compared to the main body of the Left Main LAD:  moderate caliber vessel with ostial roughly 50% stenosis. There are mild diffuse luminal irregularities proximally before there is a small first diagonal branch (D1) followed by a major small to moderate caliber second diagonal branch (D2).beyond D2 there is a focal 80-40% stenosis. There is a normal section whether or for septal perforators. Between the third and fourth septal perforator there is a hazy focal lesion that appears to be70% in 1 view but possibly 70-80% in other views.  Beyond thislesion, the vessel actually becomes relatively normal in caliber with mild luminal irregularities distally as it reaches down around the apex perfusing the inferoapex.  D1: small caliber, short vessel; angiographically normal.  D2: small moderate caliber major diagonal branch that is relatively free of disease.  Left Circumflex: large-calibervessel with a very small first branch in the mid AV groove. It then courses distally in the AV groove to give rise to a small distal AV groove/atrial branch as it then terminates as a large caliber lateral OM that courses down along the inferolateral wall. Minimal luminal irregularities.   RCA: normal caliber vessel with diffuse mild 20% proximal lesions. There is a focal 50-60% lesion just prior to the crux. There are mild diffuse luminal irregularities distally before the vessel bifurcates into the Right Posterior AV Groove Branch (RPAV) and theRight Posterior Descending Artery (RPDA). Other than the mentioned lesions, relatively normal vessel.  RPDA: moderate caliber, tortuous vessel that reaches two thirds the way to the apex.  RPL Sysytem:The RPAV begins as a small-moderate caliber vessel that gives off 2 small caliber PLV branches.  After reviewing the initial angiography, the culprit lesion was thought to be mid LAD roughly 70% lesion.  Preparation were made to proceed with FFR guided PCI on this lesion.  Percutaneous Coronary Intervention:   Guide: 5 Fr   EZ RAD  LEFT Guidewire: [place] -- FFR wire  With the FFR wire crossed the most distal mid LAD lesion, the baseline FFR reading was 0.77, on pullback proximal to this lesion the baseline FFR was 0.92  With this data the decision was made to proceed with PCI on the most significant 70-80% mid LAD lesion and avoid the other less significant lesions. Predilation Balloon: Mini Trek 2.0 mm x 15 mm;  8 Atm x 30 Sec,  Stent: Xience Alpine DES 2.25 mm x 18 mm;   12 Atm x 30 Sec, post-dilated with stent balloon to 14 Atm x 30 Sec  Final Diameter: 2.4 mm  Post deployment angiography in multiple views, with and without guidewire in place revealed excellent stent deployment and lesion coverage.  There was no evidence of dissection or perforation.  MEDICATIONS:  Anesthesia:  Local Lidocaine 4 ml  Sedation:  2 mg IV Versed, 75 mcg IV fentanyl ;   Omnipaque Contrast: 170 ml  Anticoagulation:  IV Heparin 10,500 Units ;   Anti-Platelet Agent:  Aggrastat bolus 1; Brilinta 180 mg Radial Cocktail: 5 mg Verapamil, 400 mcg NTG, 2 ml 2% Lidocaine in 10 ml NS IC NTG 200 mcg x 1   PATIENT DISPOSITION:    The patient was transferred to the PACU holding area in a hemodynamicaly stable, chest pain free condition.  The patient tolerated the procedure well, and  there were no complications.    EBL: < 58m, not including ABG and VBG samples   The patient was stable before, during, and after the procedure.  POST-OPERATIVE DIAGNOSIS:    Diffuse moderate CAD with one single severe lesion in the mid LAD of roughly 70-80% status post PCI with a Xience Apline DES 2.25 mm x 18 mm post-dilated 2.4 mm.  Moderately reduced Cardia Output by Fick  Relatively normal right heart pressures with normal LVEDP  PLAN OF CARE:  Overnight observation post-procedure.  Standard post radial and brachial cath care.  Dual antiplatelet therapy for minimum 1 year.  Start statin on discharge  Consider beta 1  selective beta blocker depending on heart rate and blood pressure overnight.   HLeonie Man M.D., M.S. C[facility] [address] [place]  [id number] [phone] 01/16/2014 6:53 PM

## 2014-01-16 NOTE — H&P (View-Only) (Signed)
PATIENTJosepha Pratt MRN: [id number] DOB: 03/28/1925 PCP: Mayra Neer, MD  Clinic Note: Chief Complaint  Patient presents with  . New Evaluation    referred by pulm -Dr Lamonte Sakai ,chest discomfort with exertion , sob ,  edema off/ on aswell as face, use Oxygen 4 liter  nasal cannula,     HPI: Christine Pratt is a 80 y.o. female with a PMH below who presents today for evaluation of exertional chest pain /discomfort with profound dyspnea. She was referred by her pulmonologist whom she sees for her interstitial lung disease (pulmonary fibrosis).  Interval History: Thumb is very pleasant woman who has been now on home oxygen for a couple weeks. She has noted now progressively worsening exertional dyspnea to the point where she is on 4 L of oxygen to see at rest and has profound dyspnea simply walking from the waiting room to the clinic room. She is also started noticing chest pressure type sensation that only occurs with exertion not at rest. The sling on for maybe 2-3 months and is getting worse. She describes as a heavy feeling in her chest -- like a band across her chest. It doesn't go anywhere the rest. In addition to feeling short of breath when she does any activity she just feels like she tires quite easily.  Cardiovascular ROS: positive for - dyspnea on exertion, edema, shortness of breath and Exertional chest discomfort and fatigue negative for - irregular heartbeat, loss of consciousness, murmur, orthopnea, palpitations, paroxysmal nocturnal dyspnea, rapid heart rate or Syncope/near syncope, TIA/amaurosis fugax. :  Past Medical History  Diagnosis Date  . Hypertension   . Arthritis   . Depression   . Pulmonary nodule   . DJD (degenerative joint disease)   . Spinal stenosis   . CKD (chronic kidney disease), stage III   . Pulmonary fibrosis     Prior Cardiac Evaluation and Past Surgical History: Past Surgical History  Procedure Laterality Date  . Tubal ligation    . Appendectomy      . Back surgery    . Transthoracic echocardiogram  12/06/2013     Mild LVH. Vigorous function with EF 65-70%. No regional WMA, high LV filling pressures, MAC with mld MR.    No Known Allergies  Current Outpatient Prescriptions  Medication Sig Dispense Refill  . amLODipine (NORVASC) 10 MG tablet Take 1 tablet by mouth daily.      Marland Kitchen b complex vitamins tablet Take 1 tablet by mouth daily.      . Cholecalciferol (VITAMIN D3) 2000 UNITS TABS Take 1 tablet by mouth daily.      Marland Kitchen escitalopram (LEXAPRO) 20 MG tablet Take 1 tablet by mouth daily.      . furosemide (LASIX) 20 MG tablet Take 1 tablet by mouth daily.      Marland Kitchen LORazepam (ATIVAN) 1 MG tablet Take 1 tablet by mouth daily.      . Omega-3 Fatty Acids (FISH OIL) 1000 MG CAPS Take 1 capsule by mouth 2 (two) times daily.      . Oxycodone HCl 20 MG TABS Take 1 tablet by mouth 3 (three) times daily.      . Polyethylene Glycol 3350 (MIRALAX PO) Take by mouth daily as needed.      . quinapril (ACCUPRIL) 20 MG tablet Take 1 tablet by mouth daily.      . vitamin C (ASCORBIC ACID) 500 MG tablet Take 500 mg by mouth daily.       No current facility-administered medications for  this visit.    History   Social History Narrative   Retired. Widowed.   Never smoked and does not drink alcohol.    family history includes Cancer in her mother; Heart attack in her father; Heart disease in her brother; Heart failure in her sister.  ROS: A comprehensive Review of Systems - was performed \Review of Systems  Constitutional: Positive for malaise/fatigue. Negative for weight loss.  HENT: Negative for congestion and nosebleeds.   Respiratory: Positive for cough, shortness of breath and wheezing. Negative for hemoptysis and sputum production.   Cardiovascular: Negative for claudication.       Per history of present illness  Gastrointestinal: Negative for heartburn, nausea, abdominal pain, constipation, blood in stool and melena.  Genitourinary: Negative  for hematuria and flank pain.  Musculoskeletal: Positive for back pain and joint pain.       And swelling OA; Is on pain medications for chronic low back pain -upper lumbar   Neurological: Negative for dizziness, sensory change, speech change, focal weakness, seizures, loss of consciousness and headaches.  Endo/Heme/Allergies: Bruises/bleeds easily.  Psychiatric/Behavioral: Negative for depression.  All other systems reviewed and are negative.   PHYSICAL EXAM BP 140/66  Pulse 75  Ht 5' 1" (1.549 m)  Wt 172 lb 4.8 oz (78.155 kg)  BMI 32.57 kg/m2 Physical Exam  Constitutional: She appears well-developed and well-nourished. No distress.  HENT:  Head: Normocephalic and atraumatic.  Mouth/Throat: Oropharynx is clear and moist. No oropharyngeal exudate.  Eyes: Conjunctivae and EOM are normal. Pupils are equal, round, and reactive to light. No scleral icterus.  Neck: Normal range of motion. Neck supple. No JVD present. No tracheal deviation present. No thyromegaly present.  Cardiovascular: Normal rate, regular rhythm and normal heart sounds.  Exam reveals no gallop and no friction rub.   No murmur heard. Pulmonary/Chest: Effort normal. No stridor. No respiratory distress. She has wheezes. She has no rales. She exhibits no tenderness.  On home oxygen at 2 L .  Late inspiratory wheezing bilaterally but left >right  Abdominal: Soft. Bowel sounds are normal. She exhibits no distension and no mass. There is no tenderness. There is no rebound and no guarding.  Genitourinary:  Deferred  Skin: Skin is warm and dry. No erythema.  Psychiatric: She has a normal mood and affect. Her behavior is normal. Thought content normal.  A bit nervous, and unsure of her judgment     Adult ECG Report  Rate: 75 ;  Rhythm: normal sinus rhythm  QRS Axis: 60 ;  PR Interval: 198 ;  QRS Duration: 78 ; QTc: 444,  Voltages: Normal  Conduction Disturbances: Borderline first-degree AV block  Other Abnormalities:  none   Narrative Interpretation: Essentially normal EKG  Recent Labs: No recent labs  ASSESSMENT / PLAN: Very concerning story with an 76 year old woman who has hypertension and pulmonary fibrosis presenting with worsening exertional dyspnea and chest pressure. While certainly this could be related to her interstitial lung disease, I am concerned that the chest discomfort may be more consistent with coronary ischemia.  I had a lengthy discussion with the patient, who was referred to him as the advice of her family (daughter). At this point I think we have to potential options for evaluating her symptoms:  1. Invasive approach with direct cardiac catheterization which would include right and left heart catheterization given her lung disease to evaluate potential and extent of pulmonary hypertension. 2. Noninvasive approach which would consist of a Lexiscan Myoview in addition to the  Echocardiogram has been performed.  The fact that she is elevated LVEDP on echocardiogram is suggestive of a higher level of diastolic dysfunction which could contribute to dyspnea. In the absence of significant LVH or hypertension, one would have to presume that is a possibility of some ischemic diastolic dysfunction.  For now, we will plan to order a LexiScan Myoview. She is going to spend the weekend discussing these options with her daughter and family members. Hopefully by next week we will know what her decision is. If she chooses to forego noninvasive evaluation I think the nature of her progressively worsening symptoms is concerning for unstable process and I do worry about the possibility of multivessel disease in this elderly woman.  Chest pain with moderate risk for cardiac etiology I agree with Dr. Lamonte Sakai that we need to consider CAD as a highly likely possibility. In this situation her chest discomfort would be considered at least Class III-IV Angina based on how much her symptoms are limited. She is on  amlodipine already on an ACE inhibitor. No beta blocker due to her pulmonary disease.  She is also on aspirin although not listed on her medication list above.   Hypoxemia Echocardiogram did not necessarily suggest elevated pulmonary pressures, however there was evidence of LVEDP elevation. This may be more consistent with possible left heart involving the posterior right heart moment for pulmonary hypertension. She only has mild edema but no PND orthopnea so involving heart failure is true etiology, making pulmonary venous congestion/hypertension as likely option to explain her oxygen requirement. The exertional dyspnea in the presence of chest discomfort component however is concerning for coronary ischemia.  If we do proceed with cardiac catheterization, moderate elevation of the right and left heart catheterization.  Essential hypertension Borderline elevated pressures today. She has had relatively normal pressures in the past of her age I would not be overly aggressive. She does seem to have heart rate Beta1selective Beta Blocker if necessary.  DOE (dyspnea on exertion) Certainly with her baseline oxygen requirement, even mild exertion may exacerbate her hypoxia and dyspnea. The presence of elevated LVEDP does increase the potential for ischemic etiology, however her interstitial lung disease tends to cloud the issue.     Orders Placed This Encounter  Procedures  . Myocardial Perfusion Imaging    Standing Status: Future     Number of Occurrences:      Standing Expiration Date: 01/05/2015    Order Specific Question:  Where should this test be performed    Answer:  [facility]    Order Specific Question:  Type of stress    Answer:  Lexiscan    Order Specific Question:  Patient weight in lbs    Answer:  172  . EKG 12-Lead  . LEFT AND RIGHT HEART CATHETERIZATION WITH CORONARY ANGIOGRAM   No orders of the defined types were placed in this encounter.    Followup: 2-3  weeks  HARDING,DAVID W, M.D., M.S. Interventional Cardiologist   Pager # [phone]

## 2014-01-16 NOTE — Plan of Care (Signed)
Problem: Consults Goal: Cardiac Cath Patient Education (See Patient Education module for education specifics.) Outcome: Completed/Met Date Met:  01/16/14 Goal: Skin Care Protocol Initiated - if Braden Score 18 or less If consults are not indicated, leave blank or document N/A Outcome: Completed/Met Date Met:  01/16/14  Problem: Phase I Progression Outcomes Goal: Pain controlled with appropriate interventions Outcome: Completed/Met Date Met:  01/16/14 Goal: Voiding-avoid urinary catheter unless indicated Outcome: Completed/Met Date Met:  01/16/14 Goal: Hemodynamically stable Outcome: Completed/Met Date Met:  01/16/14 Goal: Distal pulses equal to baseline Outcome: Completed/Met Date Met:  01/16/14 Goal: Vascular site scale level 0 - I Vascular Site Scale Level 0: No bruising/bleeding/hematoma Level I (Mild): Bruising/Ecchymosis, minimal bleeding/ooozing, palpable hematoma < 3 cm Level II (Moderate): Bleeding not affecting hemodynamic parameters, pseudoaneurysm, palpable hematoma > 3 cm Level III (Severe) Bleeding which affects hemodynamic parameters or retroperitoneal hemorrhage  Outcome: Completed/Met Date Met:  01/16/14

## 2014-01-16 NOTE — Interval H&P Note (Signed)
History and Physical Interval Note:  01/16/2014 3:44 PM  Christine Pratt  has presented today for surgery, with the diagnosis of cp, doe - concerning for class III angina as she has chest pressure and dyspneawith minimal exertion.  After initially considering the possibility of doing a nuclear stress test, she decided to choose invasive option and proceed with right and left heart catheterization.  The various methods of treatment have been discussed with the patient and family. After consideration of risks, benefits and other options for treatment, the patient has consented to  Procedure(s): LEFT AND RIGHT HEART CATHETERIZATION WITH CORONARY ANGIOGRAM (N/A) Plus or minus PCI as a surgical intervention .  The patient's history has been reviewed, patient examined, no change in status, stable for surgery.  I have reviewed the patient's chart and labs.  Questions were answered to the patient's satisfaction.    The procedure with Risks/Benefits/Alternatives and Indications was reviewed with the patient and family.  All questions were answered.    Risks / Complications include, but not limited to: Death, MI, CVA/TIA, VF/VT (with defibrillation), Bradycardia (need for temporary pacer placement), contrast induced nephropathy, bleeding / bruising / hematoma / pseudoaneurysm, vascular or coronary injury (with possible emergent CT or Vascular Surgery), adverse medication reactions, infection.  Additional risks involving the use of radiation with the possibility of radiation burns and cancer were explained in detail.  The patient (and family) voice understanding and agree to proceed.    Cath Lab Visit (complete for each Cath Lab visit)  Clinical Evaluation Leading to the Procedure:   ACS: No.  Non-ACS:    Anginal Classification: CCS III  Anti-ischemic medical therapy: Maximal Therapy (2 or more classes of medications)  Non-Invasive Test Results: No non-invasive testing performed  Prior CABG: No  previous CABG  Christine Pratt W

## 2014-01-17 ENCOUNTER — Encounter (HOSPITAL_COMMUNITY): Payer: Self-pay | Admitting: *Deleted

## 2014-01-17 DIAGNOSIS — I10 Essential (primary) hypertension: Secondary | ICD-10-CM | POA: Diagnosis not present

## 2014-01-17 DIAGNOSIS — J849 Interstitial pulmonary disease, unspecified: Secondary | ICD-10-CM

## 2014-01-17 DIAGNOSIS — I25119 Atherosclerotic heart disease of native coronary artery with unspecified angina pectoris: Secondary | ICD-10-CM | POA: Diagnosis not present

## 2014-01-17 DIAGNOSIS — J8410 Pulmonary fibrosis, unspecified: Secondary | ICD-10-CM | POA: Diagnosis not present

## 2014-01-17 DIAGNOSIS — R0609 Other forms of dyspnea: Secondary | ICD-10-CM

## 2014-01-17 DIAGNOSIS — I208 Other forms of angina pectoris: Secondary | ICD-10-CM

## 2014-01-17 DIAGNOSIS — E785 Hyperlipidemia, unspecified: Secondary | ICD-10-CM | POA: Diagnosis not present

## 2014-01-17 LAB — TROPONIN I: Troponin I: <0.3 ng/mL (ref <0.30)

## 2014-01-17 LAB — HEPATIC FUNCTION PANEL
ALK PHOS: 66 U/L (ref 39–117)
ALT: 19 U/L (ref 0–35)
AST: 23 U/L (ref 0–37)
Albumin: 3.7 g/dL (ref 3.5–5.2)
Bilirubin, Direct: <0.2 mg/dL (ref 0.0–0.3)
Total Bilirubin: 1 mg/dL (ref 0.3–1.2)
Total Protein: 7.7 g/dL (ref 6.0–8.3)

## 2014-01-17 LAB — CBC
HCT: 39.8 % (ref 36.0–46.0)
Hemoglobin: 13.1 g/dL (ref 12.0–15.0)
MCH: 29.2 pg (ref 26.0–34.0)
MCHC: 32.9 g/dL (ref 30.0–36.0)
MCV: 88.6 fL (ref 78.0–100.0)
PLATELETS: 220 10*3/uL (ref 150–400)
RBC: 4.49 MIL/uL (ref 3.87–5.11)
RDW: 14.1 % (ref 11.5–15.5)
WBC: 7.4 10*3/uL (ref 4.0–10.5)

## 2014-01-17 LAB — CK TOTAL AND CKMB (NOT AT ARMC)
CK, MB: 5.1 ng/mL — ABNORMAL HIGH (ref 0.3–4.0)
RELATIVE INDEX: 2.4 (ref 0.0–2.5)
Total CK: 214 U/L — ABNORMAL HIGH (ref 7–177)

## 2014-01-17 LAB — BASIC METABOLIC PANEL
Anion gap: 12 (ref 5–15)
BUN: 10 mg/dL (ref 6–23)
CALCIUM: 8.5 mg/dL (ref 8.4–10.5)
CO2: 26 mEq/L (ref 19–32)
Chloride: 100 mEq/L (ref 96–112)
Creatinine, Ser: 0.8 mg/dL (ref 0.50–1.10)
GFR calc Af Amer: 75 mL/min — ABNORMAL LOW (ref >90)
GFR, EST NON AFRICAN AMERICAN: 65 mL/min — AB (ref >90)
Glucose, Bld: 149 mg/dL — ABNORMAL HIGH (ref 70–99)
Potassium: 4.4 mEq/L (ref 3.7–5.3)
Sodium: 138 mEq/L (ref 137–147)

## 2014-01-17 LAB — LIPID PANEL
CHOL/HDL RATIO: 2.7 ratio
Cholesterol: 168 mg/dL (ref 0–200)
HDL: 63 mg/dL (ref >39)
LDL CALC: 89 mg/dL (ref 0–99)
Triglycerides: 78 mg/dL (ref <150)
VLDL: 16 mg/dL (ref 0–40)

## 2014-01-17 MED ORDER — TICAGRELOR 90 MG PO TABS: 90.0000 mg | ORAL_TABLET | Freq: Two times a day (BID) | ORAL | Status: DC

## 2014-01-17 MED ORDER — METOPROLOL TARTRATE 25 MG PO TABS
25.0000 mg | ORAL_TABLET | Freq: Two times a day (BID) | ORAL | Status: DC
  Administered 2014-01-17: 25 mg via ORAL

## 2014-01-17 MED ORDER — ATORVASTATIN CALCIUM 20 MG PO TABS: 20.0000 mg | ORAL_TABLET | Freq: Every day | ORAL | Status: DC

## 2014-01-17 MED ORDER — METOPROLOL TARTRATE 25 MG PO TABS: 25.0000 mg | ORAL_TABLET | Freq: Two times a day (BID) | ORAL | Status: DC

## 2014-01-17 NOTE — Progress Notes (Signed)
Patient Name: KETRA DUCHESNE Date of Encounter: 01/17/2014  Principal Problem:   Angina, class III Active Problems:   ILD (interstitial lung disease)   Hypoxemia   Essential hypertension   DOE (dyspnea on exertion)    Patient Profile: 80 yo female w/ hx pulm fibrosis, HTN, HLD, evaluated for SOB/CP, came in for R/L cath 11/03.  SUBJECTIVE: Has been up all night. Describes chest discomfort, says hard to describe.   According to staff, she has been restless and getting up frequently to urinate. She has chronic back pain for which she takes on his OxyContin but has not complained of any chest pain to them.  OBJECTIVE Filed Vitals:   01/16/14 2000 01/16/14 2342 01/17/14 0020 01/17/14 0537  BP: 161/103 163/71  171/79  Pulse: 75 86  82  Temp:  98.1 F (36.7 C)  98.6 F (37 C)  TempSrc:  Oral  Oral  Resp: _0 Height:      Weight:   156 lb 4.9 oz (70.9 kg)   SpO2: 97% 97%  99%    Intake/Output Summary (Last 24 hours) at 01/17/14 6144 Last data filed at 01/17/14 0536  Gross per 24 hour  Intake  549.5 ml  Output   2100 ml  Net -1550.5 ml   Filed Weights   01/16/14 1156 01/17/14 0020  Weight: 173 lb (78.472 kg) 156 lb 4.9 oz (70.9 kg)    PHYSICAL EXAM General: Well developed, well nourished, female in no acute distress. Head: Normocephalic, atraumatic.  Neck: Supple without bruits, JVD not elevated Lungs:  Resp regular and unlabored, Rales bases Heart: RRR, S1, S2, no S3, S4, or murmur; no rub. Abdomen: Soft, non-tender, non-distended, BS + x 4.  Extremities: No clubbing, cyanosis, no edema. Has ecchymosis at her right radial cath site and possibly a small hematoma, but no bruit was noted. Neuro: Alert and oriented X 3. Moves all extremities spontaneously. Psych: Normal affect.  LABS: CBC: Recent Labs  01/17/14 0334  WBC 7.4  HGB 13.1  HCT 39.8  MCV 88.6  PLT 315   Basic Metabolic Panel: Recent Labs  01/17/14 0334  NA 138  K 4.4  CL 100    CO2 26  GLUCOSE 149*  BUN 10  CREATININE 0.80  CALCIUM 8.5   Cardiac Cath:   POST-OPERATIVE DIAGNOSIS:   Diffuse moderate CAD with one single severe lesion in the mid LAD of roughly 70-80% status post PCI with a Xience Apline DES 2.25 mm x 18 mm post-dilated 2.4 mm.  Moderately reduced Cardia Output by Fick  Relatively normal right heart pressures with normal LVEDP PLAN OF CARE:  Overnight observation post-procedure.  Standard post radial and brachial cath care.  Dual antiplatelet therapy for minimum 1 year.  Start statin on discharge  Consider beta 1 selective beta blocker depending on heart rate and blood pressure overnight.  TELE:  Sinus rhythm, PVCs and pairs. ECG with no acute ischemic changes.  Current Medications:  . amLODipine  10 mg Oral Daily  . aspirin  81 mg Oral Daily  . escitalopram  20 mg Oral Daily  . oxyCODONE  20 mg Oral TID  . quinapril  20 mg Oral Daily  . ticagrelor  90 mg Oral BID      ASSESSMENT AND PLAN: Principal Problem:   Angina, class III - s/p Xience Apline DES 2.25 mm x 18 mm to the LAD, 50-60% RCA dz, treated medically. On ASA, ACE, Brilinta.  No BB, no statin. Consider adding metoprolol 25 mg bid. Also needs statin, will ck profile and LFTs.    Active Problems:   ILD (interstitial lung disease) - R pressures OK, per DH    Hypoxemia - continue home O2    Essential hypertension - on home rx except Lasix 20 mg daily, SBP 140s-160s since admit. See above    DOE (dyspnea on exertion) - last CXR in system 05/2013, fibrosis was stable.   Plan: Check CK-MB and troponin 1. ECG is not acute. Treat pain with her home dose of OxyContin. She does not feel the pain is her stomach but will add Protonix since the Brilinta is new.  DC when medically stable.  Jonetta Speak , PA-C 6:33 AM 01/17/2014

## 2014-01-17 NOTE — Discharge Summary (Signed)
CARDIOLOGY DISCHARGE SUMMARY   Patient ID: Christine Pratt MRN: [id number] DOB/AGE: 1925-09-29 80 y.o.  Admit date: 01/16/2014 Discharge date: 01/17/2014  PCP: Mayra Neer, MD Primary Cardiologist: Dr. Ellyn Hack  Primary Discharge Diagnosis:  Angina, class III   Secondary Discharge Diagnosis:    ILD (interstitial lung disease)   Hypoxemia   Essential hypertension   DOE (dyspnea on exertion)  PROCEDURES PERFORMED:   Right & Left Heart Catheterization with Native Coronary Angiography via RIGHT RADAIL Artery & RIGHT BRACHIAL Vein Access.  FRACTIONAL FLOW RESERVE (FFR) MEASUREMENT OF THE MID & PROXIMAL LAD  PERCUTANEOUS CORONARY INTERVENTION OF THE MID LAD `70-80% PRE-ADENOSINE FFR POSITIVE LESION -- XIENCE ALPINE DES 2.25 MM X 18 MM --> 2.4 MM.  Hospital Course: Christine Pratt is a 80 y.o. female with no previous history of CAD. She has a history of pulmonary fibrosis and was evaluated for chest pain. Dr. Ellyn Hack felt that cardiac catheterization was indicated and she came to the hospital for the procedure on 11/03.  Right and left cardiac catheterization results are below. She got a drug-eluting stent to the LAD and her right heart pressures were stable. She had no significant pulmonary hypertension. She tolerated the procedure well.   On 11/04, she was seen by Dr. Tamala Julian and all data were reviewed. She was having some chest pain but her ECG and labs were stable. Other labs showed no significant abnormality.  Statin therapy was indicated so she had a lipid profile and LFTs performed. Her blood pressure and heart rate were elevated and so a beta blocker was added to her medication regimen as well. She was seen by cardiac rehabilitation and ambulated with them. She was educated on stent restrictions, exercise guidelines and heart-healthy lifestyle modifications. No further inpatient workup was indicated and she was considered stable for discharge, to follow up as an  outpatient.   Labs:   Lab Results  Component Value Date   WBC 7.4 01/17/2014   HGB 13.1 01/17/2014   HCT 39.8 01/17/2014   MCV 88.6 01/17/2014   PLT 220 01/17/2014     Recent Labs Lab 01/17/14 0334 01/17/14 0835  NA 138  --   K 4.4  --   CL 100  --   CO2 26  --   BUN 10  --   CREATININE 0.80  --   CALCIUM 8.5  --   PROT  --  7.7  BILITOT  --  1.0  ALKPHOS  --  66  ALT  --  19  AST  --  23  GLUCOSE 149*  --     Recent Labs  01/17/14 0835  CKTOTAL 214*  CKMB 5.1*  TROPONINI <0.30   Lipid Panel     Component Value Date/Time   CHOL 168 01/17/2014 0835   TRIG 78 01/17/2014 0835   HDL 63 01/17/2014 0835   CHOLHDL 2.7 01/17/2014 0835   VLDL 16 01/17/2014 0835   LDLCALC 89 01/17/2014 0835   Cardiac Cath: 01/16/2014 Hemodynamics:   SaO2%  Pressures mmHg  Mean P  mmHg  EDP  mmHg   Right Atrium   8/6  7  Right Ventricle   48/6  11   Pulmonary Artery  66 40/18  27   PCWP   14/13 12    Central Aortic  99 121/64 91   Left Ventricle   122/6  8         Cardiac Output:   Cardiac Index:  Fick  3.67  2.07   Left Ventriculography: deferred Coronary Anatomy: Right coronary dominant  Left Main: very large caliber vessel that tapers distally where it bifurcates into the LAD and Circumflex both of which are relatively small in diameter compared to the main body of the Left Main LAD: moderate caliber vessel with ostial roughly 50% stenosis. There are mild diffuse luminal irregularities proximally before there is a small first diagonal branch (D1) followed by a major small to moderate caliber second diagonal branch (D2).beyond D2 there is a focal 80-40% stenosis. There is a normal section whether or for septal perforators. Between the third and fourth septal perforator there is a hazy focal lesion that appears to be70% in 1 view but possibly 70-80% in other views. Beyond thislesion, the vessel actually becomes  relatively normal in caliber with mild luminal irregularities distally as it reaches down around the apex perfusing the inferoapex.  D1: small caliber, short vessel; angiographically normal.  D2: small moderate caliber major diagonal branch that is relatively free of disease. Left Circumflex: large-calibervessel with a very small first branch in the mid AV groove. It then courses distally in the AV groove to give rise to a small distal AV groove/atrial branch as it then terminates as a large caliber lateral OM that courses down along the inferolateral wall. Minimal luminal irregularities.  RCA: normal caliber vessel with diffuse mild 20% proximal lesions. There is a focal 50-60% lesion just prior to the crux. There are mild diffuse luminal irregularities distally before the vessel bifurcates into the Right Posterior AV Groove Branch (RPAV) and theRight Posterior Descending Artery (RPDA). Other than the mentioned lesions, relatively normal vessel.  RPDA: moderate caliber, tortuous vessel that reaches two thirds the way to the apex.  RPL Sysytem:The RPAV begins as a small-moderate caliber vessel that gives off 2 small caliber PLV branches. After reviewing the initial angiography, the culprit lesion was thought to be mid LAD roughly 70% lesion. Preparation were made to proceed with FFR guided PCI on this lesion. Percutaneous Coronary Intervention:  Guide: 5 Fr EZ RAD LEFTGuidewire: [place] -- FFR wire  With the FFR wire crossed the most distal mid LAD lesion, the baseline FFR reading was 0.77, on pullback proximal to this lesion the baseline FFR was 0.92  With this data the decision was made to proceed with PCI on the most significant 70-80% mid LAD lesion and avoid the other less significant lesions. Predilation Balloon: Mini Trek 2.0 mm x 15 mm;  8 Atm x 30 Sec,  Stent: Xience Alpine DES 2.25 mm x 18 mm;   12 Atm x 30 Sec, post-dilated with stent balloon to 14 Atm x 30  Sec  Final Diameter: 2.4 mm Post deployment angiography in multiple views, with and without guidewire in place revealed excellent stent deployment and lesion coverage. There was no evidence of dissection or perforation. POST-OPERATIVE DIAGNOSIS:   Diffuse moderate CAD with one single severe lesion in the mid LAD of roughly 70-80% status post PCI with a Xience Apline DES 2.25 mm x 18 mm post-dilated 2.4 mm.  Moderately reduced Cardia Output by Fick  Relatively normal right heart pressures with normal LVEDP PLAN OF CARE:  Overnight observation post-procedure.  Standard post radial and brachial cath care.  Dual antiplatelet therapy for minimum 1 year.  Start statin on discharge  Consider beta 1 selective beta blocker depending on heart rate and blood pressure overnight.  EKG: 01/17/2014 Sinus rhythm, first-degree AV block, no acute ischemic changes  FOLLOW UP PLANS AND APPOINTMENTS No Known Allergies   Medication List    TAKE these medications        amLODipine 10 MG tablet  Commonly known as:  NORVASC  Take 10 mg by mouth daily.  Notes to Patient:  Blood pressure and chest pain     atorvastatin 20 MG tablet  Commonly known as:  LIPITOR  Take 1 tablet (20 mg total) by mouth daily.  Notes to Patient:  Cholesterol NEW MEDICATION     b complex vitamins tablet  Take 1 tablet by mouth daily.     escitalopram 20 MG tablet  Commonly known as:  LEXAPRO  Take 20 mg by mouth daily.     Fish Oil 1000 MG Caps  Take 1 capsule by mouth 2 (two) times daily.     furosemide 20 MG tablet  Commonly known as:  LASIX  Take 20 mg by mouth daily.  Notes to Patient:  Fluid pill  Decreases work of the heart Decreases fluid that heart has to pump around     LORazepam 1 MG tablet  Commonly known as:  ATIVAN  Take 1 mg by mouth daily.  Notes to Patient:  Anxiety medication     metoprolol tartrate 25 MG tablet  Commonly known as:  LOPRESSOR  Take 1 tablet (25 mg total) by mouth  2 (two) times daily.  Notes to Patient:  Decreases the work of the heart Decreases heart rate and blood pressure NEW MEDICATION     MIRALAX PO  Take 17 g by mouth daily as needed (for constipation).     Oxycodone HCl 20 MG Tabs  Take 20 mg by mouth 3 (three) times daily.  Notes to Patient:  Pain medication     quinapril 20 MG tablet  Commonly known as:  ACCUPRIL  Take 1 tablet by mouth daily.  Notes to Patient:  Blood pressure     ticagrelor 90 MG Tabs tablet  Commonly known as:  BRILINTA  Take 1 tablet (90 mg total) by mouth 2 (two) times daily.  Notes to Patient:  Decreases risk of clots forming and prevents heart attack NEW MEDICATION     Vitamin D3 2000 UNITS Tabs  Take 2,000 Units by mouth daily.        Discharge Instructions    Amb Referral to Cardiac Rehabilitation    Complete by:  As directed   Or might be better for Pulm rehab     Diet - low sodium heart healthy    Complete by:  As directed      Increase activity slowly    Complete by:  As directed           Follow-up Information    Follow up with Leonie Man, MD.   Specialty:  Cardiology   Why:  The office will call   Contact information:   [address] [place] [address] [phone]       BRING ALL MEDICATIONS WITH YOU TO FOLLOW UP APPOINTMENTS  Time spent with patient to include physician time: 38 min Signed: Rosaria Ferries, PA-C 01/17/2014, 4:44 PM Co-Sign MD

## 2014-01-17 NOTE — Discharge Instructions (Signed)
PLEASE REMEMBER TO BRING ALL OF YOUR MEDICATIONS TO EACH OF YOUR FOLLOW-UP OFFICE VISITS. ° °PLEASE ATTEND ALL SCHEDULED FOLLOW-UP APPOINTMENTS.  ° °Activity: Increase activity slowly as tolerated. You may shower, but no soaking baths (or swimming) for 1 week. No driving for 2 days. No lifting over 5 lbs for 1 week. No sexual activity for 1 week.  ° °You May Return to Work: in 1 week (if applicable) ° °Wound Care: You may wash cath site gently with soap and water. Keep cath site clean and dry. If you notice pain, swelling, bleeding or pus at your cath site, please call [phone]. ° ° ° °Cardiac Cath Site Care °Refer to this sheet in the next few weeks. These instructions provide you with information on caring for yourself after your procedure. Your caregiver may also give you more specific instructions. Your treatment has been planned according to current medical practices, but problems sometimes occur. Call your caregiver if you have any problems or questions after your procedure. °HOME CARE INSTRUCTIONS °· You may shower 24 hours after the procedure. Remove the bandage (dressing) and gently wash the site with plain soap and water. Gently pat the site dry.  °· Do not apply powder or lotion to the site.  °· Do not sit in a bathtub, swimming pool, or whirlpool for 5 to 7 days.  °· No bending, squatting, or lifting anything over 10 pounds (4.5 kg) as directed by your caregiver.  °· Inspect the site at least twice daily.  °· Do not drive home if you are discharged the same day of the procedure. Have someone else drive you.  °· You may drive 24 hours after the procedure unless otherwise instructed by your caregiver.  °What to expect: °· Any bruising will usually fade within 1 to 2 weeks.  °· Blood that collects in the tissue (hematoma) may be painful to the touch. It should usually decrease in size and tenderness within 1 to 2 weeks.  °SEEK IMMEDIATE MEDICAL CARE IF: °· You have unusual pain at the site or down the  affected limb.  °· You have redness, warmth, swelling, or pain at the site.  °· You have drainage (other than a small amount of blood on the dressing).  °· You have chills.  °· You have a fever or persistent symptoms for more than 72 hours.  °· You have a fever and your symptoms suddenly get worse.  °· Your leg becomes pale, cool, tingly, or numb.  °· You have heavy bleeding from the site. Hold pressure on the site.  °Document Released: 04/04/2010 Document Revised: 02/19/2011 Document Reviewed: 04/04/2010 °ExitCare® Patient Information ©2012 [employer]. ° °

## 2014-01-17 NOTE — Progress Notes (Signed)
CARDIAC REHAB PHASE I   PRE:  Rate/Rhythm: 80 SR    BP: sitting 137/57    SaO2: 97 3 1/2L  MODE:  Ambulation: 200 ft   POST:  Rate/Rhythm: 92 SR    BP: sitting 140/61     SaO2: 89-91 4L  Pt steady. Used 4L to walk which is what she wears at home. Stated somewhat SOB toward end of walk, much improved from PTA. Denied chest discomfort. SaO2 low normal after walk. Ed completed. Gave walking gl to increase tolerance. Pt is thinking about [facility] or Pulm Rehab. Will send referral.  [phone]   Darrick Meigs CES, ACSM 01/17/2014 9:22 AM

## 2014-01-17 NOTE — Care Management Note (Addendum)
  Page 2 of 2   01/17/2014     10:04:35 PM CARE MANAGEMENT NOTE 01/17/2014  Patient:  Christine Pratt, Christine Pratt   Account Number:  [id number]  Date Initiated:  01/17/2014  Documentation initiated by:  Laporsha Grealish  Subjective/Objective Assessment:   CP     Action/Plan:   CM to follow for disposition needs   Anticipated DC Date:  01/17/2014   Anticipated DC Plan:  [place]  CM consult  Medication Assistance      Choice offered to / List presented to:             Status of service:  Completed, signed off Medicare Important Message given?   (If response is "NO", the following Medicare IM given date fields will be blank) Date Medicare IM given:   Medicare IM given by:   Date Additional Medicare IM given:   Additional Medicare IM given by:    Discharge Disposition:  HOME/SELF CARE  Per UR Regulation:  Reviewed for med. necessity/level of care/duration of stay  If discussed at [place] of Stay Meetings, dates discussed:    Comments:  Rajeev Escue RN, BSN, MSHL, CCM  Nurse - Case Manager,  ([facility] [place])  [phone]  01/17/2014 01/17/2014 Per  [facility] [place]  # [phone] BRILINTA 90 MG BID COVER-YES CO-PAY-  $ 60.00  60 TABLET FOR 30 DAY SUPPLY TIER-3 DRUG PRIOR APPROVAL PHARMACY- [facility] PATIENT CAN USE MAIL ORDER FOR 30 DAY AND 90 DAYS SUPPLY  01/17/14 [place], RN, BSN, [place] [phone] Spoke with pt at bedside regarding benefits check for Brilinta.  Pt has brochure with 30 day free card and refill assistance card intact.  Pt utilizes [facility] on [address] for prescription needs.  NCM called pharmacy to confirm availability of medication.  Information relayed to pt.  Pt verbalizes importance of filling medication upon discharge.  Chas Axel RN, BSN, MSHL, CCM  Nurse - Case Manager,  (Unit [phone]  01/17/2014 Cardiac cath procedure Specialty Med Review:  Brilinta.  Benefits Check in  progress Dispo Plan:  Home / Self care.

## 2014-01-18 ENCOUNTER — Telehealth: Payer: Self-pay | Admitting: Cardiology

## 2014-01-22 NOTE — Telephone Encounter (Signed)
Closed enounter

## 2014-01-26 ENCOUNTER — Telehealth: Payer: Self-pay | Admitting: *Deleted

## 2014-01-26 NOTE — Telephone Encounter (Signed)
Faxed signed cardiac rehab phase ll

## 2014-01-29 ENCOUNTER — Telehealth: Payer: Self-pay | Admitting: *Deleted

## 2014-01-29 NOTE — Telephone Encounter (Signed)
LATE ENTRY ON 01/25/14,-FAXED -SIGNED CARDIAC REHAB PHASE II

## 2014-01-31 ENCOUNTER — Telehealth (HOSPITAL_COMMUNITY): Payer: Self-pay | Admitting: Cardiac Rehabilitation

## 2014-01-31 NOTE — Telephone Encounter (Signed)
-----  Message from Leonie Man, MD sent at 01/30/2014  7:38 PM EST ----- Regarding: RE: cardiac rehab Yes  [place] ----- Message -----    From: Lowell Guitar, RN    Sent: 01/30/2014  11:38 AM      To: Leonie Man, MD Subject: cardiac rehab                                  Dear Dr. Ellyn Hack,   Pt is interested in parctipating in cardiac rehab. She is 02 dependent using 4L at home.  Is it ok to use titrate oxygen to keep 02 sat>90%with exercise?  Thank you, Andi Hence, RN, BSN Cardiac Pulmonary Rehab

## 2014-02-01 ENCOUNTER — Ambulatory Visit (INDEPENDENT_AMBULATORY_CARE_PROVIDER_SITE_OTHER): Payer: Medicare Other | Admitting: Cardiology

## 2014-02-01 ENCOUNTER — Encounter: Payer: Self-pay | Admitting: Cardiology

## 2014-02-01 VITALS — BP 120/60 | HR 66 | Ht 61.0 in | Wt 169.0 lb

## 2014-02-01 DIAGNOSIS — R5383 Other fatigue: Secondary | ICD-10-CM

## 2014-02-01 MED ORDER — NITROGLYCERIN 0.4 MG SL SUBL: 0.4000 mg | SUBLINGUAL_TABLET | SUBLINGUAL | Status: AC | PRN

## 2014-02-01 NOTE — Progress Notes (Signed)
02/01/2014 Christine Pratt   Jan 17, 1926  [id number]  Primary Physician Mayra Neer, MD Primary Cardiologist: Dr. Ellyn Hack  HPI: Christine Pratt 80 year old female, followed by Dr. Ellyn Hack with Pratt history of pulmonary fibrosis (followed by Dr. Lamonte Sakai on 4 L of home oxygen), hypertension and hyperlipidemia. She was referred to Dr. Ellyn Hack for evaluation of exertional dyspnea. During her initial evaluation with Dr. Ellyn Hack, she also noted having exertional chest tightness and pressure that was concerning for class III angina. It was decided at that time to proceed with right and left cardiac catheterization for definitive assessment of her coronary arteries and evaluation of pulmonary pressures. This was performed by Dr. Ellyn Hack at [facility] on 01/16/2014. She was found to have diffuse moderate CAD with one single severe lesion in the mid LAD of roughly 70-80% stenosis. This was successfully treated with PCI utilizing Pratt drug-eluting stent. She was found to have relatively normal right heart pressures with normal LVEDP. Left ventriculography was deferred, however an echo was obtained prior to cath in September of this year and EF was normal at 65-70%. She was placed on dual antiplatelet therapy with aspirin and Brilinta along with Pratt statin, ACE inhibitor, and beta blocker.  She presents to clinic today for post hospital follow-up. She is accompanied by her daughter. She states that she has been doing fairly well since discharge. She denies any recurrent chest pain. She denies any increased dyspnea beyond her baseline. She continues on 4 L of supplemental oxygen and has not had to increase her O2. Her right radial access site has remained stable and free from complication. She reports compliance with her medications, however she was not under the impression that she was to take aspirin on Pratt daily basis. She states that aspirin was not listed on her medication list at time of discharge. She does complain of  increased fatigue since discharge from the hospital. She feels tired all the time. She denies any melena or hematochezia. No syncope/near-syncope.    Current Outpatient Prescriptions  Medication Sig Dispense Refill  . amLODipine (NORVASC) 10 MG tablet Take 10 mg by mouth daily.     Marland Kitchen atorvastatin (LIPITOR) 20 MG tablet Take 1 tablet (20 mg total) by mouth daily. 30 tablet 11  . b complex vitamins tablet Take 1 tablet by mouth daily.    . Cholecalciferol (VITAMIN D3) 2000 UNITS TABS Take 2,000 Units by mouth daily.     Marland Kitchen escitalopram (LEXAPRO) 20 MG tablet Take 20 mg by mouth daily.     . furosemide (LASIX) 20 MG tablet Take 20 mg by mouth daily.     Marland Kitchen LORazepam (ATIVAN) 1 MG tablet Take 1 mg by mouth daily.     . metoprolol tartrate (LOPRESSOR) 25 MG tablet Take 1 tablet (25 mg total) by mouth 2 (two) times daily. 60 tablet 11  . Omega-3 Fatty Acids (FISH OIL) 1000 MG CAPS Take 1 capsule by mouth 2 (two) times daily.    . Oxycodone HCl 20 MG TABS Take 20 mg by mouth 3 (three) times daily.     . Polyethylene Glycol 3350 (MIRALAX PO) Take 17 g by mouth daily as needed (for constipation).     . quinapril (ACCUPRIL) 20 MG tablet Take 1 tablet by mouth daily.    . ticagrelor (BRILINTA) 90 MG TABS tablet Take 1 tablet (90 mg total) by mouth 2 (two) times daily. 60 tablet 11   No current facility-administered medications for this visit.    No  Known Allergies  History   Social History  . Marital Status: Widowed    Spouse Name: N/Pratt    Number of Children: N/Pratt  . Years of Education: N/Pratt   Occupational History  . Retired    Social History Main Topics  . Smoking status: Never Smoker   . Smokeless tobacco: Never Used  . Alcohol Use: No  . Drug Use: No  . Sexual Activity: Not on file   Other Topics Concern  . Not on file   Social History Narrative   Retired. Widowed.   Never smoked and does not drink alcohol.     Review of Systems: General: negative for chills, fever, night  sweats or weight changes.  Cardiovascular: negative for chest pain, dyspnea on exertion, edema, orthopnea, palpitations, paroxysmal nocturnal dyspnea or shortness of breath Dermatological: negative for rash Respiratory: negative for cough or wheezing Urologic: negative for hematuria Abdominal: negative for nausea, vomiting, diarrhea, bright red blood per rectum, melena, or hematemesis Neurologic: negative for visual changes, syncope, or dizziness All other systems reviewed and are otherwise negative except as noted above.    Height _0  (1.549 m), weight 169 lb (76.658 kg).  General appearance: alert, cooperative and no distress Neck: no carotid bruit and no JVD Lungs: clear to auscultation bilaterally Heart: regular rate and rhythm, S1, S2 normal, no murmur, click, rub or gallop Extremities: no LEE Pulses: 2+ and symmetric Skin: warm and dry Neurologic: Grossly normal  EKG  sinus rhythm with first-degree AV block with occasional premature ventricular complexes. Heart rate 66 bpm  ASSESSMENT AND PLAN:   1. Unstable angina: Resolved after recent PCI of the LAD. PRN sublingual nitroglycerin was prescribed in the event that recurrent chest pain symptoms occur.  She was given directions on the proper use of sublingual nitroglycerin and when it is appropriate to call 911.  2. CAD: Status post-PCI plus DES to mid LAD. No recurrent chest pain. EKG without ischemic abnormalities. Continue Brilinta twice Pratt day. Patient instructed to start taking low dose, 81 mg, aspirin daily. Continue beta blocker, ACE inhibitor and statin.  3. Hypertension: Blood pressure is well-controlled at 120/60. Continue current medication regimen.  4. Dyslipidemia: Last lipid panel 01/17/2014 revealed LDL at 89. Goal in the setting of CAD is < 70. Continue Lipitor.  5. Interstitial lung disease: On home oxygen. She is on Pratt cardioselective beta blocker and denies any worsening in breathing status since discharge. She  is followed by Dr. Lamonte Sakai.  6. Fatigue: Patient notes increased fatigue since discharge from the hospital. She denies melena and hematochezia. However, in the setting of recent initiation of antiplatelet therapy with Brilinta, we will check Pratt CBC to make sure she's not anemic.   PLAN  Continue medical therapy as outlined in detail above. Follow-up with Dr. Ellyn Hack for reassessment in 6-8 weeks.  Shakendra Griffeth, BRITTAINYPA-C 02/01/2014 12:00 PM

## 2014-02-01 NOTE — Patient Instructions (Signed)
Your physician recommends that you schedule a follow-up appointment in: 6 Weeks with Dr Ellyn Hack  Your physician has recommended you make the following change in your medication: Start an 81 mg baby Aspirin and continue current medication

## 2014-02-22 ENCOUNTER — Encounter (HOSPITAL_COMMUNITY): Payer: Self-pay | Admitting: Cardiology

## 2014-03-01 ENCOUNTER — Ambulatory Visit (INDEPENDENT_AMBULATORY_CARE_PROVIDER_SITE_OTHER): Payer: Medicare Other | Admitting: Emergency Medicine

## 2014-03-01 ENCOUNTER — Encounter: Payer: Self-pay | Admitting: Emergency Medicine

## 2014-03-01 VITALS — BP 122/80 | Ht 61.0 in | Wt 176.0 lb

## 2014-03-01 DIAGNOSIS — J849 Interstitial pulmonary disease, unspecified: Secondary | ICD-10-CM

## 2014-03-01 NOTE — Patient Instructions (Signed)
We will repeat your CT scan of the chest to compare with 2014 We will reorder your swallowing evaluation Continue your oxygen at all times.  Follow with Dr Lamonte Sakai next available opening.

## 2014-03-01 NOTE — Assessment & Plan Note (Signed)
We will repeat your CT scan of the chest to compare with 2014 We will reorder your swallowing evaluation Continue your oxygen at all times.  Follow with Dr Ayana Imhof next available opening.  

## 2014-03-01 NOTE — Progress Notes (Signed)
   Subjective:    Patient ID: Christine Pratt, female    DOB: [birth year], 80 y.o.   MRN: [id number]  HPI 80 yo never smoker followed by Dr Brigitte Pulse for HTN, OA, CKD. Carries a dx of ILD in a UIP pattern with some mediastinal LAD first noted on a CT scan performed 10/15/08 to eval a RML pleural based nodule. The nodule has been stable on subsequent scans, last 03/2012. There may be some progression of her ILD but only slight. Has tried Advair without effect in the past. She was just started on O2, was given symbicort but hasn;t started it yet. Denies any cough. She does not have an aspiration sx.  She has exertional CP that gets better when she stops to rest.   She had normal spirometry 09/28/12.   ROV 12/29/13 -- f/u visit for ILD and dyspnea. She underwent TTE > normal LV fxn and PAP. Also autoimmune labs > all normal.  She describes exertional SOB and CP, not with walking but with heavier exertion. No AFL on her spirometry, but she believes that Symbicort may help her a bit - she has not used reliably.   ROV 03/01/14 -- f/u visit for ILD and dyspnea with evaluation as above. She was evaluated by cardiology and underwent cardiac catheterization in November with stent placement. She has not had her swallowing eval yet to her knowledge. She may feel a bit better. She still is concerned that she is hypoxemic. She denies any aspiration sx. Her last Ct scan was 03/2012.   Review of Systems  Constitutional: Negative for fever and unexpected weight change.  HENT: Positive for rhinorrhea. Negative for congestion, dental problem, ear pain, nosebleeds, postnasal drip, sinus pressure, sneezing, sore throat and trouble swallowing.   Eyes: Negative for redness and itching.  Respiratory: Positive for cough, shortness of breath and wheezing. Negative for chest tightness.   Cardiovascular: Positive for leg swelling. Negative for palpitations.  Gastrointestinal: Negative for nausea and vomiting.  Genitourinary: Negative for  dysuria.  Musculoskeletal: Positive for joint swelling.  Skin: Negative for rash.  Neurological: Negative for headaches.  Hematological: Does not bruise/bleed easily.  Psychiatric/Behavioral: Negative for dysphoric mood. The patient is not nervous/anxious.         Objective:   Physical Exam Filed Vitals:   03/01/14 1330  BP: 122/80  Height: _0  (1.549 m)  Weight: 176 lb (79.833 kg)   Gen: Pleasant, elderly woman, in no distress,  normal affect on O2  ENT: No lesions,  mouth clear,  oropharynx clear, no postnasal drip  Neck: No JVD, no TMG, no carotid bruits  Lungs: No use of accessory muscles, B insp squeaks and crackles all fields  Cardiovascular: RRR, heart sounds normal, no murmur or gallops, no peripheral edema  Musculoskeletal: No deformities, no cyanosis or clubbing  Neuro: alert, non focal  Skin: Warm, no lesions or rashes       Assessment & Plan:  ILD (interstitial lung disease) We will repeat your CT scan of the chest to compare with 2014 We will reorder your swallowing evaluation Continue your oxygen at all times.  Follow with Dr Lamonte Sakai next available opening

## 2014-03-05 ENCOUNTER — Ambulatory Visit (INDEPENDENT_AMBULATORY_CARE_PROVIDER_SITE_OTHER)
Admission: RE | Admit: 2014-03-05 | Discharge: 2014-03-05 | Disposition: A | Discharge status: Home / Self Care | Payer: Medicare Other | Source: Ambulatory Visit | Attending: Emergency Medicine | Admitting: Emergency Medicine

## 2014-03-05 DIAGNOSIS — J849 Interstitial pulmonary disease, unspecified: Secondary | ICD-10-CM

## 2014-03-06 ENCOUNTER — Ambulatory Visit (INDEPENDENT_AMBULATORY_CARE_PROVIDER_SITE_OTHER): Payer: Medicare Other | Admitting: Cardiology

## 2014-03-06 ENCOUNTER — Encounter: Payer: Self-pay | Admitting: Cardiology

## 2014-03-06 VITALS — BP 124/78 | HR 74 | Ht 61.0 in | Wt 167.6 lb

## 2014-03-06 DIAGNOSIS — E785 Hyperlipidemia, unspecified: Secondary | ICD-10-CM

## 2014-03-06 DIAGNOSIS — E669 Obesity, unspecified: Secondary | ICD-10-CM

## 2014-03-06 DIAGNOSIS — R0609 Other forms of dyspnea: Secondary | ICD-10-CM

## 2014-03-06 DIAGNOSIS — Z9861 Coronary angioplasty status: Secondary | ICD-10-CM

## 2014-03-06 DIAGNOSIS — I10 Essential (primary) hypertension: Secondary | ICD-10-CM

## 2014-03-06 DIAGNOSIS — I2510 Atherosclerotic heart disease of native coronary artery without angina pectoris: Secondary | ICD-10-CM

## 2014-03-06 NOTE — Patient Instructions (Signed)
IN FEB 2016, WILL CHANGE BRILITINA TO PLAVIX (CLOPIDOGREL) 75 MG DAILY. WILL CALL YOU AND LET YOU KNOW WHEN TO START THE MEDICATIONS.  SHORT OF BREATHE IS FROM INSTERSTITIAL LUNG /PULMOMNARY FIBROSIS,YOUR LUNGS ARE NOT FULLY FUNCTIONING. HEART IS FULLING FUNCTIONING, WOULD LIKE TO INCREASE WALKING EVEN IF YOU HAVE TO USE YOUR WALKER.  Your physician wants you to follow-up in [place]. You will receive a reminder letter in the mail two months in advance. If you don't receive a letter, please call our office to schedule the follow-up appointment.

## 2014-03-10 ENCOUNTER — Encounter: Payer: Self-pay | Admitting: Cardiology

## 2014-03-10 DIAGNOSIS — Z9861 Coronary angioplasty status: Principal | ICD-10-CM

## 2014-03-10 DIAGNOSIS — I2510 Atherosclerotic heart disease of native coronary artery without angina pectoris: Secondary | ICD-10-CM | POA: Insufficient documentation

## 2014-03-10 DIAGNOSIS — E785 Hyperlipidemia, unspecified: Secondary | ICD-10-CM | POA: Insufficient documentation

## 2014-03-10 NOTE — Assessment & Plan Note (Signed)
Status post PCI to mid LAD with improvement of her chest tightness symptoms, but she still has her exertional dyspnea which is probably then related to her underlying lung disease. Her pulmonary pressures are not that high as I would doubt that pulmonary hypertension as a major role. She is on aspirin plus Brilinta for now. With her respiratory issues and was switch her to Plavix on after she completes her  current month prescription. We will check a P2Y-12 assay 2 weeks after starting Plavix. She is on an ACE inhibitor plus statin. I would be agreeable to switching to an ARB if pulmonary medicine would prefer. She is also on low-dose Lopressor which is a beta 1 selective beta blocker.

## 2014-03-10 NOTE — Progress Notes (Signed)
PCP: Mayra Neer, MD  Clinic Note: Chief Complaint  Patient presents with  . Follow-up    6 week follow up, Pt. experiencing SOB, and swelling, Pt. denies chest pain and pressure.   HPI: Christine Pratt is a 80 y.o. female with a PMH below who presents today second post PCI follow-up. She had a heart catheterization back on November 3 with relatively normal right heart Pressures and a moderately severe mid LAD lesion that was positive on FFR for a ratio of 0.77. I then stented that with a drug-eluting stent. Her symptoms always been exertional and resting dyspnea but worse with exertion but prior to her PCI she was noting exertional chest tightness and pressure (consistent with class III angina) that she is no longer noting. She saw Ellen Henri, PA-C back on November 19, and was doing relatively well with no recurrent chest pain or pressure. Her dyspnea continued to be present and was now on 4 L of oxygen. She continues to have a baseline oxygen requirement of 4 L but really denies significant PND or orthopnea suggests a significant CHF component. No recurrent anginal symptoms. No melena, hematochezia or hematuria. No rapid or irregular heartbeat/palpitations, syncope/near syncope or TIA/amaurosis fugax. She notes chronic fatigue and tires very easily.  Apparently she is under the misunderstanding that she was not necessarily needing to take daily aspirin. This was clarified.  Past Medical History  Diagnosis Date  . CKD (chronic kidney disease), stage III   . Pulmonary fibrosis     Interstitial lung disease on home oxygen; mild to moderate pulmonary hypertension by RHC (PAP 40/18 mmHg)  . CAD S/P percutaneous coronary angioplasty 01/16/2014    mLAD 70-80% (+ FFR)--> XIENCE ALPINE DES 2.25 MM X 18 MM --> 2.4 MM.  Marland Kitchen Essential hypertension   . Spinal stenosis   . Osteoarthritis   . DJD (degenerative joint disease)   . Depression   . Pulmonary nodule    Prior Cardiac Evaluation and  Past Surgical History: Procedure Laterality Date  . Transthoracic echocardiogram  12/06/2013     Mild LVH. Vigorous function with EF 65-70%. No regional WMA, high LV filling pressures, MAC with mld MR.  . Left and right heart catheterization with coronary angiogram N/A 01/16/2014    L & R HCATH W/ COR ANGIO; Leonie Man, MD;  [facility] CATH LAB; 70-80% mid LAD --> PCI, 50% mid RCA, PAP 40/18 mmHg, PCWP 12 mmHg  . Percutaneous coronary stent intervention (pci-s)  01/16/2014    mLAD - XIENCE ALPINE DES 2.25 MM X 18 MM --> 2.4 MM.   ROS: A comprehensive was performed. Review of Systems  Constitutional: Positive for malaise/fatigue.  HENT: Positive for nosebleeds.   Respiratory: Positive for cough, shortness of breath and wheezing. Negative for sputum production.   Cardiovascular: Negative for palpitations, orthopnea, claudication and leg swelling.       Per history of present illness  Gastrointestinal: Negative for blood in stool and melena.  Genitourinary: Positive for dysuria (This is a chronic issue. She had that off and on antibiotics for some time) and frequency. Negative for hematuria.  Neurological: Positive for dizziness (ooccasional positional symptoms ). Negative for loss of consciousness.  Endo/Heme/Allergies: Bruises/bleeds easily.  Psychiatric/Behavioral: Positive for depression and memory loss. The patient is not nervous/anxious and does not have insomnia.   All other systems reviewed and are negative.   Current Outpatient Prescriptions on File Prior to Visit  Medication Sig Dispense Refill  . amLODipine (NORVASC) 10 MG  tablet Take 10 mg by mouth daily.     Marland Kitchen aspirin 81 MG tablet Take 81 mg by mouth daily.    Marland Kitchen atorvastatin (LIPITOR) 20 MG tablet Take 1 tablet (20 mg total) by mouth daily. 30 tablet 11  . b complex vitamins tablet Take 1 tablet by mouth daily.    . Cholecalciferol (VITAMIN D3) 2000 UNITS TABS Take 2,000 Units by mouth daily.     Marland Kitchen escitalopram (LEXAPRO) 20 MG  tablet Take 20 mg by mouth daily.     . furosemide (LASIX) 20 MG tablet Take 20 mg by mouth daily.     Marland Kitchen LORazepam (ATIVAN) 1 MG tablet Take 1 mg by mouth daily.     . metoprolol tartrate (LOPRESSOR) 25 MG tablet Take 1 tablet (25 mg total) by mouth 2 (two) times daily. 60 tablet 11  . nitroGLYCERIN (NITROSTAT) 0.4 MG SL tablet Place 1 tablet (0.4 mg total) under the tongue every 5 (five) minutes as needed for chest pain. 25 tablet 3  . Omega-3 Fatty Acids (FISH OIL) 1000 MG CAPS Take 1 capsule by mouth 2 (two) times daily.    . Oxycodone HCl 20 MG TABS Take 20 mg by mouth 3 (three) times daily.     . Polyethylene Glycol 3350 (MIRALAX PO) Take 17 g by mouth daily as needed (for constipation).     . quinapril (ACCUPRIL) 20 MG tablet Take 1 tablet by mouth daily.    . ticagrelor (BRILINTA) 90 MG TABS tablet Take 1 tablet (90 mg total) by mouth 2 (two) times daily. 60 tablet 11   No current facility-administered medications on file prior to visit.   No Known Allergies  SOCIAL AND FAMILY HISTORY REVIEWED IN EPIC -- no change  Wt Readings from Last 3 Encounters:  03/06/14 167 lb 9.6 oz (76.023 kg)  03/01/14 176 lb (79.833 kg)  02/01/14 169 lb (76.658 kg)    PHYSICAL EXAM BP 124/78 mmHg  Pulse 74  Ht _0  (1.549 m)  Wt 167 lb 9.6 oz (76.023 kg)  BMI 31.68 kg/m2 General appearance: alert, cooperative, appears stated age, no distress and mildly obese Neck: no adenopathy, no carotid bruit and minimal JVD Lungs: Diffuse mild late history wheezes. No rales but intermittent crackles noted., normal percussion bilaterally and non-labored; is on oxygen at 2 L per nasal cannula Heart: regular rate and rhythm, S1& S2 normal, no murmur, click, rub or gallop; nondisplaced PMI Abdomen: soft, non-tender; bowel sounds normal; no masses,  no organomegaly;  Extremities: extremities normal, atraumatic, no cyanosis, or edema Pulses: 2+ and symmetric;\ Neurologic: Mental status: Alert, oriented, thought  content appropriate Cranial nerves: normal (II-XII grossly intact)   Adult ECG Report - not checked  Recent Labs:    Lab Results  Component Value Date   CHOL 168 01/17/2014   HDL 63 01/17/2014   LDLCALC 89 01/17/2014   TRIG 78 01/17/2014   CHOLHDL 2.7 01/17/2014    ASSESSMENT / PLAN: CAD S/P percutaneous coronary angioplasty Status post PCI to mid LAD with improvement of her chest tightness symptoms, but she still has her exertional dyspnea which is probably then related to her underlying lung disease. Her pulmonary pressures are not that high as I would doubt that pulmonary hypertension as a major role. She is on aspirin plus Brilinta for now. With her respiratory issues and was switch her to Plavix on after she completes her  current month prescription. We will check a P2Y-12 assay 2 weeks after starting Plavix.  She is on an ACE inhibitor plus statin. I would be agreeable to switching to an ARB if pulmonary medicine would prefer. She is also on low-dose Lopressor which is a beta 1 selective beta blocker.   DOE (dyspnea on exertion) As noted above. Now that she is no longer having her angina, she continues to have exertional dyspnea most likely related to her lung disease.  Essential hypertension Improved control today on ACE inhibitor and beta blocker. As indicated above we could switch to ARB if the pulmonologist feel that the ACE inhibitor is causing her interstitial lung disease. I am switching from Brilinta to Plavix to avoid any potential overlap of respirations symptoms.  Obesity (BMI 30-39.9) We discussed dietary modification. I'm not sure how was exercising she is revealed to do. I did encourage her to walk as much she can even if it means increasing her oxygen inflow.  Hyperlipidemia with target LDL less than 70 Fairly well controlled, but for CAD 1 improved LDL number. She is on atorvastatin 20 mg and tolerating it well. It will be extremely see the trend from future  checks.   No orders of the defined types were placed in this encounter.   Meds ordered this encounter  Medications  . polyethylene glycol powder (GLYCOLAX/MIRALAX) powder    Sig:     Refill:  0    Followup: 6 months   HARDING, Leonie Green, M.D., M.S. Interventional Cardiologist   Pager # [phone]

## 2014-03-10 NOTE — Assessment & Plan Note (Signed)
Fairly well controlled, but for CAD 1 improved LDL number. She is on atorvastatin 20 mg and tolerating it well. It will be extremely see the trend from future checks.

## 2014-03-10 NOTE — Assessment & Plan Note (Signed)
Improved control today on ACE inhibitor and beta blocker. As indicated above we could switch to ARB if the pulmonologist feel that the ACE inhibitor is causing her interstitial lung disease. I am switching from Brilinta to Plavix to avoid any potential overlap of respirations symptoms.

## 2014-03-10 NOTE — Assessment & Plan Note (Signed)
We discussed dietary modification. I'm not sure how was exercising she is revealed to do. I did encourage her to walk as much she can even if it means increasing her oxygen inflow.

## 2014-03-10 NOTE — Assessment & Plan Note (Signed)
As noted above. Now that she is no longer having her angina, she continues to have exertional dyspnea most likely related to her lung disease.

## 2014-03-23 ENCOUNTER — Encounter: Payer: Self-pay | Admitting: Emergency Medicine

## 2014-03-23 ENCOUNTER — Ambulatory Visit (INDEPENDENT_AMBULATORY_CARE_PROVIDER_SITE_OTHER): Payer: Medicare Other | Admitting: Emergency Medicine

## 2014-03-23 VITALS — BP 130/78 | HR 64 | Temp 98.4°F | Ht 61.0 in | Wt 166.0 lb

## 2014-03-23 DIAGNOSIS — J849 Interstitial pulmonary disease, unspecified: Secondary | ICD-10-CM

## 2014-03-23 NOTE — Assessment & Plan Note (Signed)
Etiology is unclear. She denies any aspiration symptoms and she has not gone to get her repeat swallow evaluation. Her CT scan of the chest performed December 2015 is stable. No new interventions beyond oxygen with exertion. We will follow-up in 6 months

## 2014-03-23 NOTE — Patient Instructions (Signed)
Your CT scan of the chest performed in December 2015 shows that your lung scarring (interstitial lung disease) is still present but has not changed significantly compared with January 2014. This is good news Please continue to use your oxygen with all exertion and while sleeping. You may take your oxygen off when you are sitting at rest Follow with Dr Lamonte Sakai in 6 months or sooner if you have any problems

## 2014-03-23 NOTE — Progress Notes (Signed)
Subjective:    Patient ID: Christine Pratt, female    DOB: 1925-12-16, 81 y.o.   MRN: [id number]  HPI 81 yo never smoker followed by Dr Brigitte Pulse for HTN, OA, CKD. Carries a dx of ILD in a UIP pattern with some mediastinal LAD first noted on a CT scan performed 10/15/08 to eval a RML pleural based nodule. The nodule has been stable on subsequent scans, last 03/2012. There may be some progression of her ILD but only slight. Has tried Advair without effect in the past. She was just started on O2, was given symbicort but hasn;t started it yet. Denies any cough. She does not have an aspiration sx.  She has exertional CP that gets better when she stops to rest.   She had normal spirometry 09/28/12.   ROV 12/29/13 -- f/u visit for ILD and dyspnea. She underwent TTE > normal LV fxn and PAP. Also autoimmune labs > all normal.  She describes exertional SOB and CP, not with walking but with heavier exertion. No AFL on her spirometry, but she believes that Symbicort may help her a bit - she has not used reliably.   ROV 03/01/14 -- f/u visit for ILD and dyspnea with evaluation as above. She was evaluated by cardiology and underwent cardiac catheterization in November with stent placement. She has not had her swallowing eval yet to her knowledge. She may feel a bit better. She still is concerned that she is hypoxemic. She denies any aspiration sx. Her last Ct scan was 03/2012.   ROV 03/23/14 -- follows today for hypoxemia and interstitial lung disease. We repeated her CT scan of the chest in December 15. There continues to be a UIP pattern but without any large scale progression compared with January 2014.   Review of Systems  Constitutional: Negative for fever and unexpected weight change.  HENT: Positive for rhinorrhea. Negative for congestion, dental problem, ear pain, nosebleeds, postnasal drip, sinus pressure, sneezing, sore throat and trouble swallowing.   Eyes: Negative for redness and itching.  Respiratory:  Positive for cough, shortness of breath and wheezing. Negative for chest tightness.   Cardiovascular: Positive for leg swelling. Negative for palpitations.  Gastrointestinal: Negative for nausea and vomiting.  Genitourinary: Negative for dysuria.  Musculoskeletal: Positive for joint swelling.  Skin: Negative for rash.  Neurological: Negative for headaches.  Hematological: Does not bruise/bleed easily.  Psychiatric/Behavioral: Negative for dysphoric mood. The patient is not nervous/anxious.         Objective:   Physical Exam Filed Vitals:   03/23/14 1528 03/23/14 1531  BP:  130/78  Pulse:  64  Temp: 98.4 F (36.9 C)   TempSrc: Oral   Height: _0  (1.549 m)   Weight: 166 lb (75.297 kg)   SpO2:  97%   Gen: Pleasant, elderly woman, in no distress,  normal affect on O2  ENT: No lesions,  mouth clear,  oropharynx clear, no postnasal drip  Neck: No JVD, no TMG, no carotid bruits  Lungs: No use of accessory muscles, B insp squeaks and crackles all fields  Cardiovascular: RRR, heart sounds normal, no murmur or gallops, no peripheral edema  Musculoskeletal: No deformities, no cyanosis or clubbing  Neuro: alert, non focal  Skin: Warm, no lesions or rashes     03/05/14 --  COMPARISON: Chest CT 03/29/2012.  FINDINGS: Portions of the examination are severely limited by patient respiratory motion.  Mediastinum: Heart size is borderline enlarged. There is no significant pericardial fluid, thickening or pericardial  calcification. There is atherosclerosis of the thoracic aorta, the great vessels of the mediastinum and the coronary arteries, including calcified atherosclerotic plaque in the left main, left anterior descending, left circumflex and right coronary arteries. Severe calcifications of the mitral valve. Numerous borderline enlarged mediastinal and bilateral hilar lymph nodes are noted, but are nonspecific. Esophagus is unremarkable in  appearance.  Lungs/Pleura: Again noted is a pattern of peripheral and peribronchovascular reticulation, with associated architectural distortion, septal thickening, lower lung parenchymal banding, cylindrical and varicose traction bronchiectasis, and a few areas of early honeycombing. These features have a definitive craniocaudal gradient, and are strongly suggestive of usual interstitial pneumonia. Inspiratory and expiratory imaging is severely limited by respiratory motion, but does appear to demonstrates some air trapping, indicative of small airways disease. No confluent consolidative airspace disease. No pleural effusions.  Upper Abdomen: Extensive atherosclerosis.  Musculoskeletal: There are no aggressive appearing lytic or blastic lesions noted in the visualized portions of the skeleton.  IMPRESSION: 1. The appearance of the lungs is again most compatible with usual interstitial pneumonia (UIP), with very little progression of disease noted when compared to the prior examination. 2. Air trapping is noted, indicative of small airways disease. 3. Atherosclerosis, including left main and 3 vessel coronary artery disease. 4. Additional incidental findings, as above.      Assessment & Plan:  ILD (interstitial lung disease) Etiology is unclear. She denies any aspiration symptoms and she has not gone to get her repeat swallow evaluation. Her CT scan of the chest performed December 2015 is stable. No new interventions beyond oxygen with exertion. We will follow-up in 6 months

## 2014-05-02 ENCOUNTER — Telehealth (HOSPITAL_COMMUNITY): Payer: Self-pay | Admitting: Cardiac Rehabilitation

## 2014-05-02 NOTE — Telephone Encounter (Signed)
Pt has been contacted multiple times to discuss enrolling in cardiac or pulmonary rehabilitation.  Pt shows preference toward pulmonary rehab program however is concerned with cost.  Pt [employer] 0158 policy benefits for cardiac or pulmonary rehab, pt has 20% coinsurance until $4700 out of pocket maximum met.  Pt will call back when she is ready to schedule pulmonary rehab.

## 2014-05-16 ENCOUNTER — Encounter: Payer: Self-pay | Admitting: Cardiology

## 2014-05-18 NOTE — Telephone Encounter (Signed)
Spoke to patient  Discuss changing Brilinta to Plavix Patient receive March dose of the medication. RN will contact patient the week of March 21,2016- switch over to Plavix and lab slip Patient verbalized understanding and preferred to do these , instead of sending information now.

## 2014-05-18 NOTE — Telephone Encounter (Signed)
-----  Message from Leonie Man, MD sent at 05/17/2014  9:18 PM EST ----- Regarding: Converting from Brilinta to Plavix [name], during her last visit the plan was for her to switch from Brilinta to Plavix. She was post a call and tell us when she was nearing the end of her Brilinta prescription. I don't want to miss any doses, but I think we be okay switching her to Plavix. The plan will be to check a P 2y12 assay 2 weeks after converting. Continue call her to see what her schedule is and when we can make this change. Her number is [phone].

## 2014-05-31 ENCOUNTER — Telehealth: Payer: Self-pay | Admitting: Cardiology

## 2014-05-31 NOTE — Telephone Encounter (Signed)
Please call,questuon about her Brilinta.

## 2014-05-31 NOTE — Telephone Encounter (Signed)
Spoke with patient. She was calling concerning changing her Brilinta to Plavix. Informed her I saw the notation below in EPIC and she decided she wants to wait until she sees Dr. Ellyn Hack in June to make the medications change  Raiford Simmonds, RN at 05/18/2014 8:40 AM     Status: Signed       Expand All Collapse All   Spoke to patient  Discuss changing Brilinta to Plavix Patient receive March dose of the medication. RN will contact patient the week of March 21,2016- switch over to Plavix and lab slip Patient verbalized understanding and preferred to do these , instead of sending information now.            Raiford Simmonds, RN at 05/18/2014 8:40 AM     Status: Signed       Expand All Collapse All   ----- Message from Leonie Man, MD sent at 05/17/2014 9:18 PM EST ----- Regarding: Converting from Brilinta to Plavix Christine Pratt, during her last visit the plan was for her to switch from Brilinta to Plavix. She was post a call and tell us when she was nearing the end of her Brilinta prescription. I don't want to miss any doses, but I think we be okay switching her to Plavix. The plan will be to check a P 2y12 assay 2 weeks after converting. Continue call her to see what her schedule is and when we can make this change. Her number is [phone].

## 2014-09-06 ENCOUNTER — Ambulatory Visit: Payer: Medicare Other | Admitting: Cardiology

## 2014-11-15 ENCOUNTER — Ambulatory Visit (INDEPENDENT_AMBULATORY_CARE_PROVIDER_SITE_OTHER): Payer: Medicare Other | Admitting: Cardiology

## 2014-11-15 VITALS — BP 92/56 | HR 69 | Ht 61.0 in | Wt 160.8 lb

## 2014-11-15 DIAGNOSIS — I2510 Atherosclerotic heart disease of native coronary artery without angina pectoris: Secondary | ICD-10-CM | POA: Diagnosis not present

## 2014-11-15 DIAGNOSIS — R0609 Other forms of dyspnea: Secondary | ICD-10-CM | POA: Diagnosis not present

## 2014-11-15 DIAGNOSIS — E669 Obesity, unspecified: Secondary | ICD-10-CM

## 2014-11-15 DIAGNOSIS — I10 Essential (primary) hypertension: Secondary | ICD-10-CM

## 2014-11-15 DIAGNOSIS — E785 Hyperlipidemia, unspecified: Secondary | ICD-10-CM | POA: Diagnosis not present

## 2014-11-15 DIAGNOSIS — Z9861 Coronary angioplasty status: Secondary | ICD-10-CM | POA: Diagnosis not present

## 2014-11-15 DIAGNOSIS — R5382 Chronic fatigue, unspecified: Secondary | ICD-10-CM

## 2014-11-15 MED ORDER — CLOPIDOGREL BISULFATE 75 MG PO TABS: ORAL_TABLET | ORAL | Status: DC

## 2014-11-15 MED ORDER — METOPROLOL TARTRATE 25 MG PO TABS: 12.5000 mg | ORAL_TABLET | Freq: Two times a day (BID) | ORAL | Status: DC

## 2014-11-15 MED ORDER — METOPROLOL TARTRATE 25 MG PO TABS: 37.5000 mg | ORAL_TABLET | Freq: Two times a day (BID) | ORAL | Status: DC

## 2014-11-15 NOTE — Progress Notes (Signed)
PCP: Mayra Neer, MD  Clinic Note: Chief Complaint  Patient presents with  . Follow-up    pt c/o bruising due to a medication she is taking, not sure which one  . Shortness of Breath    on exertion  . Edema    when she sits for a long time  . Fatigue    has been really tired  . Coronary Artery Disease    HPI: Christine Pratt is a 81 y.o. female with a PMH below who presents today for h/o CAD-PCI of LAD. She has baseline dyspnea from Interstitial Lung Disease (ILD) - wears home O2 @ 4L.   Christine Pratt was last seen on Dec 2015 -- plan was to transition from Brilinta to Plavix, but never got the Rx  Recent Hospitalizations: n/a  Studies Reviewed: no new studies  Interval History: She presents with noted c/o bruising & fatigue.  Stable dyspnea. Does not do much walking -- does get DOE with "overdoing it".   She was worried about some bruising and being short of breath so she decided to take up back on medicine. She initially she could not stop the Brilinta, so she cut the other twice a day and driving which apparently is Lopressor. She is now taking one pill once a day status post twice a day. Otherwise cardiac standpoint she has not had any anginal chest tightness or pressure with rest or exertion. She has a baseline dyspnea and hasn't noted that being worse. She has mild swelling at the end of the day, but most notable thing is that she has been just more fatigued and tired than usual -- although this is what she complained of last time she was here as well..  Cardiac ROS. No chest pain /pressure with rest or exertion.  No PND, orthopnea -- late in the day,she may get some mild swelling.Marland Kitchen  No palpitations, lightheadedness, dizziness, weakness or syncope/near syncope. No TIA/amaurosis fugax symptoms. No melena, hematochezia, hematuria, or epstaxis (occasional mild pink twinge, but no bleed), ++ easy bruising No claudication  Past Medical History  Diagnosis Date  . CKD  (chronic kidney disease), stage III   . Pulmonary fibrosis     Interstitial lung disease on home oxygen; mild to moderate pulmonary hypertension by RHC (PAP 40/18 mmHg)  . CAD S/P percutaneous coronary angioplasty 01/16/2014    mLAD 70-80% (+ FFR)--> XIENCE ALPINE DES 2.25 MM X 18 MM --> 2.4 MM.  Marland Kitchen Essential hypertension   . Spinal stenosis   . Osteoarthritis   . DJD (degenerative joint disease)   . Depression   . Pulmonary nodule     ROS: A comprehensive was performed. Review of Systems  Constitutional: Positive for malaise/fatigue.  HENT: Positive for nosebleeds (Mild pain pink tinged drainage).   Respiratory: Positive for cough (Occasional), shortness of breath and wheezing. Negative for sputum production.   Cardiovascular: Positive for leg swelling. Negative for claudication.  Gastrointestinal: Negative for blood in stool and melena.  Genitourinary: Positive for dysuria (Chronic dysuria). Negative for hematuria.  Musculoskeletal: Positive for joint pain. Negative for myalgias and falls.  Neurological: Positive for dizziness. Negative for focal weakness and loss of consciousness.  Endo/Heme/Allergies: Bruises/bleeds easily.  Psychiatric/Behavioral: Positive for depression and memory loss.  All other systems reviewed and are negative.   Past Surgical History  Procedure Laterality Date  . Tubal ligation    . Appendectomy    . Back surgery    . Transthoracic echocardiogram  12/06/2013  Mild LVH. Vigorous function with EF 65-70%. No regional WMA, high LV filling pressures, MAC with mld MR.  . Left and right heart catheterization with coronary angiogram N/A 01/16/2014    L & R HCATH W/ COR ANGIO; Leonie Man, MD;  [facility] CATH LAB; 70-80% mid LAD --> PCI, 50% mid RCA, PAP 40/18 mmHg, PCWP 12 mmHg  . Percutaneous coronary stent intervention (pci-s)  01/16/2014    mLAD - XIENCE ALPINE DES 2.25 MM X 18 MM --> 2.4 MM.   Prior to Admission medications   Medication Sig Start Date End  Date Taking? Authorizing Provider  amLODipine (NORVASC) 10 MG tablet Take 10 mg by mouth daily.     Historical Provider, MD  aspirin 81 MG tablet Take 81 mg by mouth daily.    Historical Provider, MD  atorvastatin (LIPITOR) 20 MG tablet Take 1 tablet (20 mg total) by mouth daily. 01/17/14   Rhonda G Barrett, PA-C  b complex vitamins tablet Take 1 tablet by mouth daily.    Historical Provider, MD  Cholecalciferol (VITAMIN D3) 2000 UNITS TABS Take 2,000 Units by mouth daily.     Historical Provider, MD  escitalopram (LEXAPRO) 20 MG tablet Take 20 mg by mouth daily.     Historical Provider, MD  furosemide (LASIX) 20 MG tablet Take 20 mg by mouth daily.     Historical Provider, MD  LORazepam (ATIVAN) 1 MG tablet Take 1 mg by mouth daily.     Historical Provider, MD  metoprolol tartrate (LOPRESSOR) 25 MG tablet Take 1 tablet (25 mg total) by mouth 2 (two) times daily. 01/17/14   Rhonda G Barrett, PA-C  nitroGLYCERIN (NITROSTAT) 0.4 MG SL tablet Place 1 tablet (0.4 mg total) under the tongue every 5 (five) minutes as needed for chest pain. 02/01/14   Brittainy Erie Noe, PA-C  Omega-3 Fatty Acids (FISH OIL) 1000 MG CAPS Take 1 capsule by mouth 2 (two) times daily.    Historical Provider, MD  Oxycodone HCl 20 MG TABS Take 20 mg by mouth 3 (three) times daily.     Historical Provider, MD  Polyethylene Glycol 3350 (MIRALAX PO) Take 17 g by mouth daily as needed (for constipation).     Historical Provider, MD  polyethylene glycol powder (GLYCOLAX/MIRALAX) powder  02/13/14   Historical Provider, MD  quinapril (ACCUPRIL) 20 MG tablet Take 1 tablet by mouth daily.    Historical Provider, MD  ticagrelor (BRILINTA) 90 MG TABS tablet Take 1 tablet (90 mg total) by mouth 2 (two) times daily. 01/17/14   Evelene Croon Barrett, PA-C   No Known Allergies   Social History   Social History  . Marital Status: Widowed    Spouse Name: N/A  . Number of Children: N/A  . Years of Education: N/A   Occupational History  .  Retired    Social History Main Topics  . Smoking status: Never Smoker   . Smokeless tobacco: Never Used  . Alcohol Use: No  . Drug Use: No  . Sexual Activity: Not Asked   Other Topics Concern  . None   Social History Narrative   Retired. Widowed.   Never smoked and does not drink alcohol.   Family History  Problem Relation Age of Onset  . Heart failure Sister   . Cancer Mother     kidney  . Heart attack Father   . Heart disease Brother     Wt Readings from Last 3 Encounters:  11/15/14 160 lb 12.8 oz (72.938  kg)  03/23/14 166 lb (75.297 kg)  03/06/14 167 lb 9.6 oz (76.023 kg)    PHYSICAL EXAM BP 92/56 mmHg  Pulse 69  Ht _0  (1.549 m)  Wt 160 lb 12.8 oz (72.938 kg)  BMI 30.40 kg/m2 General appearance:  frail elderly woman on O2 by [place]; alert, cooperative, appears stated age, no distress and mildly obese; Neck: no adenopathy, no carotid bruit and minimal JVD Lungs: Diffuse mild late history wheezes. No rales but intermittent crackles noted., normal percussion bilaterally and non-labored; is on oxygen at 2 L per nasal cannula Heart: regular rate and rhythm, S1& S2 normal, no murmur, click, rub or gallop; nondisplaced PMI Abdomen: soft, non-tender; bowel sounds normal; no masses, no organomegaly;  Extremities: extremities normal, atraumatic, no cyanosis, or edema Pulses: 2+ and symmetric;\ Neurologic: Mental status: Alert, oriented, thought content appropriate Cranial nerves: normal (II-XII grossly intact)   Adult ECG Report  Rate: 69 ;  Rhythm: normal sinus rhythm and 1 AVB;   Narrative Interpretation: Otherwise normal/stable EKG   Other studies Reviewed: Additional studies/ records that were reviewed today include:  Recent Labs:   Lab Results  Component Value Date   CHOL 168 01/17/2014   HDL 63 01/17/2014   LDLCALC 89 01/17/2014   TRIG 78 01/17/2014   CHOLHDL 2.7 01/17/2014    ASSESSMENT / PLAN: Problem List Items Addressed This Visit    CAD S/P  percutaneous coronary angioplasty - Primary (Chronic)    No active anginal symptoms. Likely would not improve her exertional dyspnea, but no further anginal symptoms. The plan was to convert from Coumadin to Plavix. We'll go ahead and do that at the completion of her recently filled prescription of Brilinta. I do want her to stop the aspirin while she is taking Brilinta and restart when she converts to Plavix. I would then stop aspirin one year out from her PCI, and use Plavix alone.      Relevant Medications   metoprolol tartrate (LOPRESSOR) 25 MG tablet   Other Relevant Orders   EKG 12-Lead (Completed)   DOE (dyspnea on exertion) (Chronic)    Problem pretty much at her baseline. She has no signs of bleeding to suspect anemia. His mouth gets twinges to her nasal discharge she probably related to dry nares from her oxygen tubing.      Relevant Orders   EKG 12-Lead (Completed)   Essential hypertension (Chronic)    Actually hypotensive today. In retrospect, I will have her cut her ARB dose in half. If recommended by her medicine, and he'll be fine to switch from ACE inhibitor to ARB with overdosing. Switching Lopressor to 12.5 mg twice a day      Relevant Medications   metoprolol tartrate (LOPRESSOR) 25 MG tablet   Other Relevant Orders   EKG 12-Lead (Completed)   Fatigue (Chronic)    Not really sure what the etiology of this is. I would like her to be on a beta blocker which she has herself cut down to one tablet a day. I was asked that she take one half tablet twice a day. Maybe this will give her a little more blood pressure and help her fatigue. If not I would simply just have to stop the beta blocker completely.      Hyperlipidemia with target LDL less than 70 (Chronic)    She is on statin, and relatively pleased as her LDL is less than 100. Would like her to be better, but I'm not to push too  hard given her age for fear of adverse side effects. She is taking fish oil.       Relevant Medications   metoprolol tartrate (LOPRESSOR) 25 MG tablet   Other Relevant Orders   EKG 12-Lead (Completed)   Obesity (BMI 30-39.9) (Chronic)    Really borderline obese versus overweight. I continued to encourage her to walk as best she can with her oxygen.  Perhaps she can use increased oxygen as necessary for walking. Also dietary adjustments to avoid excess color can take.         Current medicines are reviewed at length with the patient today. (+/- concerns) bruising - on Brilinta.  She cut metoprolol to one tablet daily The following changes have been made:  DECREASE METOPROLOL 25 MG TO 12.5 MG (  1/2 TABLETS) TWICE A DAY   STOP TAKING ASPIRIN WHILE YOU USE THE BRILINTA -COMPLETED THE BOTTLE. AFTER THAT START CLOPIDOGREL 75 MG THE FIRST DAY TAKE 4 TABLETS THEN TAKE 1 TABLET DAILY AND RESTART ASPRIN 81 MG DAILY.  Your physician wants you to follow-up in FEB 2017 [place].   - decrease amlodipine dose to 5 mg daily  Studies Ordered:   Orders Placed This Encounter  Procedures  . EKG 12-Lead      Leonie Man, M.D., M.S. Interventional Cardiologist   Pager # [phone]

## 2014-11-15 NOTE — Patient Instructions (Addendum)
DECREASE METOPROLOL 25 MG TO 12.5 MG (  1/2 TABLETS) TWICE A DAY   STOP TAKING ASPIRIN WHILE YOU USE THE BRILINTA -COMPLETED THE BOTTLE. AFTER THAT START CLOPIDOGREL 75 MG THE FIRST DAY TAKE 4 TABLETS THEN TAKE 1 TABLET DAILY AND RESTART ASPRIN 81 MG DAILY.   Your physician wants you to follow-up in FEB 2017 [place].  You will receive a reminder letter in the mail two months in advance. If you don't receive a letter, please call our office to schedule the follow-up appointment.

## 2014-11-17 ENCOUNTER — Other Ambulatory Visit: Payer: Self-pay | Admitting: Cardiology

## 2014-11-17 ENCOUNTER — Encounter: Payer: Self-pay | Admitting: Cardiology

## 2014-11-17 DIAGNOSIS — R5383 Other fatigue: Secondary | ICD-10-CM | POA: Insufficient documentation

## 2014-11-17 DIAGNOSIS — Z9861 Coronary angioplasty status: Principal | ICD-10-CM

## 2014-11-17 DIAGNOSIS — I10 Essential (primary) hypertension: Secondary | ICD-10-CM

## 2014-11-17 DIAGNOSIS — I2510 Atherosclerotic heart disease of native coronary artery without angina pectoris: Secondary | ICD-10-CM

## 2014-11-17 MED ORDER — AMLODIPINE BESYLATE 10 MG PO TABS: 5.0000 mg | ORAL_TABLET | Freq: Every day | ORAL | Status: DC

## 2014-11-17 NOTE — Assessment & Plan Note (Addendum)
Actually hypotensive today. In retrospect, I will have her cut her ARB dose in half. If recommended by her medicine, and he'll be fine to switch from ACE inhibitor to ARB with overdosing. Switching Lopressor to 12.5 mg twice a day

## 2014-11-17 NOTE — Assessment & Plan Note (Signed)
She is on statin, and relatively pleased as her LDL is less than 100. Would like her to be better, but I'm not to push too hard given her age for fear of adverse side effects. She is taking fish oil.

## 2014-11-17 NOTE — Assessment & Plan Note (Signed)
No active anginal symptoms. Likely would not improve her exertional dyspnea, but no further anginal symptoms. The plan was to convert from Coumadin to Plavix. We'll go ahead and do that at the completion of her recently filled prescription of Brilinta. I do want her to stop the aspirin while she is taking Brilinta and restart when she converts to Plavix. I would then stop aspirin one year out from her PCI, and use Plavix alone.

## 2014-11-17 NOTE — Assessment & Plan Note (Addendum)
Not really sure what the etiology of this is. I would like her to be on a beta blocker which she has herself cut down to one tablet a day. I was asked that she take one half tablet twice a day. Maybe this will give her a little more blood pressure and help her fatigue. If not I would simply just have to stop the beta blocker completely.

## 2014-11-17 NOTE — Assessment & Plan Note (Signed)
Really borderline obese versus overweight. I continued to encourage her to walk as best she can with her oxygen.  Perhaps she can use increased oxygen as necessary for walking. Also dietary adjustments to avoid excess color can take.

## 2014-11-17 NOTE — Assessment & Plan Note (Signed)
Problem pretty much at her baseline. She has no signs of bleeding to suspect anemia. His mouth gets twinges to her nasal discharge she probably related to dry nares from her oxygen tubing.

## 2014-12-05 ENCOUNTER — Other Ambulatory Visit: Payer: Self-pay | Admitting: *Deleted

## 2014-12-05 NOTE — Telephone Encounter (Signed)
CALLED PHARMACY TO CLARIFY THE DOSAGE OF LOPRESSOR.

## 2014-12-17 ENCOUNTER — Telehealth: Payer: Self-pay | Admitting: Cardiology

## 2014-12-17 NOTE — Telephone Encounter (Signed)
Ulice Brilliant from [facility] is calling to get clarification on Amlodipine. The prescription they receive is for 21m  With instructions to take a half of tablet daily , but she was taking one whole pill want to verify , which should it be . Please call   Thanks

## 2014-12-17 NOTE — Telephone Encounter (Signed)
Dose clarification of 21m Amlodipine daily given. TUlice Brilliantvoiced understanding - will fill at this dose. No further needs.

## 2015-01-17 ENCOUNTER — Telehealth: Payer: Self-pay | Admitting: Cardiology

## 2015-01-17 NOTE — Telephone Encounter (Signed)
OK - we had recently reduced Metoprolol anyway. OK with 10 mg Amlodipine  She is now off Brilinta & should be on Plavix - correct?  Christine Man, MD

## 2015-01-17 NOTE — Telephone Encounter (Signed)
Spoke with pt, she has stopped her metoprolol, she reports it made her weak. She has increased her amlodipine to 10 mg. Her bp is running 107 to 135/80's at home and she feels better off the metoprolol. Will make dr harding aware

## 2015-01-17 NOTE — Telephone Encounter (Signed)
Mrs. Suppa is calling about Amlodipine was change to 72m instead of 111m, but she has stopped taking the Brilinta..  Her blood pressure has been running high and wants to know what should she look for to see if the Amlodipine is working .  Please call   Thanks

## 2015-01-18 NOTE — Telephone Encounter (Signed)
Patient aware of Dr Ellyn Hack conmments Patient states she is taking clopidogrel.

## 2015-01-23 ENCOUNTER — Telehealth: Payer: Self-pay | Admitting: Cardiology

## 2015-01-23 DIAGNOSIS — I2510 Atherosclerotic heart disease of native coronary artery without angina pectoris: Secondary | ICD-10-CM

## 2015-01-23 DIAGNOSIS — I10 Essential (primary) hypertension: Secondary | ICD-10-CM

## 2015-01-23 DIAGNOSIS — Z9861 Coronary angioplasty status: Principal | ICD-10-CM

## 2015-01-23 MED ORDER — AMLODIPINE BESYLATE 10 MG PO TABS: 10.0000 mg | ORAL_TABLET | Freq: Every day | ORAL | Status: DC

## 2015-01-23 NOTE — Telephone Encounter (Signed)
Spoke with pt, aware okay to take amlodipine 10 mg once daily.

## 2015-01-23 NOTE — Telephone Encounter (Signed)
Pt blood pressure have started going up,she wants to go back taking 10 mg of Amlodipine.

## 2015-05-09 ENCOUNTER — Encounter: Payer: Self-pay | Admitting: Cardiology

## 2015-05-09 ENCOUNTER — Ambulatory Visit (INDEPENDENT_AMBULATORY_CARE_PROVIDER_SITE_OTHER): Payer: Medicare Other | Admitting: Cardiology

## 2015-05-09 VITALS — BP 100/56 | HR 81 | Ht 61.0 in | Wt 164.9 lb

## 2015-05-09 DIAGNOSIS — E785 Hyperlipidemia, unspecified: Secondary | ICD-10-CM

## 2015-05-09 DIAGNOSIS — R0609 Other forms of dyspnea: Secondary | ICD-10-CM | POA: Diagnosis not present

## 2015-05-09 DIAGNOSIS — Z9861 Coronary angioplasty status: Secondary | ICD-10-CM

## 2015-05-09 DIAGNOSIS — I10 Essential (primary) hypertension: Secondary | ICD-10-CM | POA: Diagnosis not present

## 2015-05-09 DIAGNOSIS — I2510 Atherosclerotic heart disease of native coronary artery without angina pectoris: Secondary | ICD-10-CM

## 2015-05-09 MED ORDER — AMLODIPINE BESYLATE 5 MG PO TABS: 5.0000 mg | ORAL_TABLET | Freq: Every day | ORAL | Status: DC

## 2015-05-09 NOTE — Patient Instructions (Addendum)
LIPID - LABS SLIP  DO NOT EAT OR DRINK  THE MORNING OF THE TEST.  DECREASE AMLODIPINE TO  5 MG  ONE  TABLET  DAILY   STOP ASPIRIN  Your physician wants you to follow-up in  6 months with DR   HARDING. You will receive a reminder letter in the mail two months in advance. If you don't receive a letter, please call our office to schedule the follow-up appointment.

## 2015-05-09 NOTE — Progress Notes (Signed)
PCP: Mayra Neer, MD  Clinic Note: Chief Complaint  Patient presents with  . Annual Exam    CAD//pt c/o SOB on exertion, fatigue, and occasional swelling in bilateral hands  . Coronary Artery Disease    HPI: Christine Pratt is a 82 y.o. female with a PMH below who presents today for ~5-6 month f/u CAD-PCI of LAD. Initial cath for Class III angina/DOE in Nov 2015 --> DES to LAD (FFR guided) She has baseline dyspnea from Interstitial Lung Disease (ILD) - wears home O2 @ 4L. Mild PHTN on [place] was last seen on Dec 2015 -- plan was to transition from Brilinta to Plavix, but never got the Rx - now on Plavix. She also stopped Metoprolol  Recent Hospitalizations: n/a  Studies Reviewed: no new studies  Interval History: She presents with noted c/o bruising & fatigue.   Stable dyspnea. Does not do much walking b/c Back & Hip/Leg pain -- does get DOE with "overdoing it".   Doing less b/c wearing herself out caring for Dtr who had Guillan-Barre' after hip replacement.  She was worried about some bruising and being short of breath so she decided to take up back on medicine. She initially she could not stop the Brilinta, so she cut the other twice a day and driving which apparently is Lopressor. She is now taking one pill once a day status post twice a day. Otherwise cardiac standpoint she has not had any anginal chest tightness or pressure with rest or exertion. She has a baseline dyspnea and hasn't noted that being worse. She has mild swelling at the end of the day, but most notable thing is that she has been just more fatigued and tired than usual -- although this is what she complained of last time she was here as well..  Cardiac ROS. No chest pain /pressure with rest or exertion.  No PND, orthopnea -- late in the day,she may get some mild swelling.Marland Kitchen  No palpitations, lightheadedness, dizziness, weakness or syncope/near syncope. No TIA/amaurosis fugax symptoms. No melena,  hematochezia, hematuria, or epstaxis (occasional mild pink twinge, but no bleed), ++ easy bruising No claudication   Past Medical History  Diagnosis Date  . CKD (chronic kidney disease), stage III   . Pulmonary fibrosis (HCC)     Interstitial lung disease on home oxygen; mild to moderate pulmonary hypertension by RHC (PAP 40/18 mmHg)  . CAD S/P percutaneous coronary angioplasty 01/16/2014    mLAD 70-80% (+ FFR)--> XIENCE ALPINE DES 2.25 MM X 18 MM --> 2.4 MM.  Marland Kitchen Essential hypertension   . Spinal stenosis   . Osteoarthritis   . DJD (degenerative joint disease)   . Depression   . Pulmonary nodule     ROS: A comprehensive was performed.  Has not had a chance to do her necessary exercise & monitor her diet - too much time taking care of daughter. Review of Systems  Constitutional: Positive for malaise/fatigue.  HENT: Positive for nosebleeds (occasionally 2/2 O2 cannula makes the nares raw - no real bleed.).   Respiratory: Positive for cough (Occasional), shortness of breath (stable dyspnea from ILD) and wheezing. Negative for sputum production.        Dealing with current cold  Cardiovascular: Negative for claudication and leg swelling.  Gastrointestinal: Positive for constipation (chronic.). Negative for blood in stool and melena.  Genitourinary: Positive for dysuria (Chronic dysuria). Negative for hematuria.  Musculoskeletal: Positive for back pain and joint pain (Hips). Negative for myalgias  and falls.  Neurological: Negative for dizziness, focal weakness and loss of consciousness.       Sciatica pain - R > L  Endo/Heme/Allergies: Bruises/bleeds easily.  Psychiatric/Behavioral: Positive for depression and memory loss. The patient is nervous/anxious (lots of stress) and has insomnia (takes a sleep aid -- ativan).   All other systems reviewed and are negative.   Past Surgical History  Procedure Laterality Date  . Tubal ligation    . Appendectomy    . Back surgery    .  Transthoracic echocardiogram  12/06/2013     Mild LVH. Vigorous function with EF 65-70%. No regional WMA, high LV filling pressures, MAC with mld MR.  . Left and right heart catheterization with coronary angiogram N/A 01/16/2014    L & R HCATH W/ COR ANGIO; Leonie Man, MD;  [facility] CATH LAB; 70-80% mid LAD --> PCI, 50% mid RCA, PAP 40/18 mmHg, PCWP 12 mmHg  . Percutaneous coronary stent intervention (pci-s)  01/16/2014    mLAD - XIENCE ALPINE DES 2.25 MM X 18 MM --> 2.4 MM.   Prior to Admission medications   Medication Sig Start Date End Date Taking? Authorizing Provider  amLODipine (NORVASC) 10 MG tablet Take 10 mg by mouth daily.     Historical Provider, MD  aspirin 81 MG tablet Take 81 mg by mouth daily.    Historical Provider, MD  atorvastatin (LIPITOR) 20 MG tablet Take 1 tablet (20 mg total) by mouth daily. 01/17/14   Rhonda G Barrett, PA-C  b complex vitamins tablet Take 1 tablet by mouth daily.    Historical Provider, MD  Cholecalciferol (VITAMIN D3) 2000 UNITS TABS Take 2,000 Units by mouth daily.     Historical Provider, MD  escitalopram (LEXAPRO) 20 MG tablet Take 20 mg by mouth daily.     Historical Provider, MD  furosemide (LASIX) 20 MG tablet Take 20 mg by mouth daily.     Historical Provider, MD  LORazepam (ATIVAN) 1 MG tablet Take 1 mg by mouth daily.     Historical Provider, MD  metoprolol tartrate (LOPRESSOR) 25 MG tablet Take 1 tablet (25 mg total) by mouth 2 (two) times daily. 01/17/14   Rhonda G Barrett, PA-C  nitroGLYCERIN (NITROSTAT) 0.4 MG SL tablet Place 1 tablet (0.4 mg total) under the tongue every 5 (five) minutes as needed for chest pain. 02/01/14   Brittainy Erie Noe, PA-C  Omega-3 Fatty Acids (FISH OIL) 1000 MG CAPS Take 1 capsule by mouth 2 (two) times daily.    Historical Provider, MD  Oxycodone HCl 20 MG TABS Take 20 mg by mouth 3 (three) times daily.     Historical Provider, MD  Polyethylene Glycol 3350 (MIRALAX PO) Take 17 g by mouth daily as needed (for  constipation).     Historical Provider, MD  polyethylene glycol powder (GLYCOLAX/MIRALAX) powder  02/13/14   Historical Provider, MD  quinapril (ACCUPRIL) 20 MG tablet Take 1 tablet by mouth daily.    Historical Provider, MD  ticagrelor (BRILINTA) 90 MG TABS tablet Take 1 tablet (90 mg total) by mouth 2 (two) times daily. 01/17/14   Evelene Croon Barrett, PA-C   No Known Allergies   Social History   Social History  . Marital Status: Widowed    Spouse Name: N/A  . Number of Children: N/A  . Years of Education: N/A   Occupational History  . Retired    Social History Main Topics  . Smoking status: Never Smoker   . Smokeless  tobacco: Never Used  . Alcohol Use: No  . Drug Use: No  . Sexual Activity: Not Asked   Other Topics Concern  . None   Social History Narrative   Retired. Widowed.   Never smoked and does not drink alcohol.   Family History  Problem Relation Age of Onset  . Heart failure Sister   . Cancer Mother     kidney  . Heart attack Father   . Heart disease Brother     Wt Readings from Last 3 Encounters:  05/09/15 164 lb 14.4 oz (74.798 kg)  11/15/14 160 lb 12.8 oz (72.938 kg)  03/23/14 166 lb (75.297 kg)    PHYSICAL EXAM BP 100/56 mmHg  Pulse 81  Ht _0  (1.549 m)  Wt 164 lb 14.4 oz (74.798 kg)  BMI 31.17 kg/m2 General appearance:  frail elderly woman on O2 by ; alert, cooperative, appears stated age, no distress and mildly obese; Neck: no adenopathy, no carotid bruit and minimal JVD Lungs: Diffuse mild late inspiratory wheezes. No rales but intermittent crackles noted., normal percussion bilaterally and non-labored; is on oxygen at 2 L per nasal cannula Heart: regular rate and rhythm, S1& S2 normal, no murmur, click, rub or gallop; nondisplaced PMI Abdomen: soft, non-tender; bowel sounds normal; no masses, no organomegaly;  Extremities: extremities normal, atraumatic, no cyanosis, or edema Pulses: 2+ and symmetric;\ Neurologic: Mental status: Alert,  oriented, thought content appropriate Cranial nerves: normal (II-XII grossly intact)   Adult ECG Report  Rate:  81;  Rhythm: normal sinus rhythm and 1 A-VB (236), borderline rightward axis (89) also noted mild ST elevations in leads II, III, aVF likely repolarization changes.;   Narrative Interpretation: Otherwise normal/stable EKG-   Other studies Reviewed: Additional studies/ records that were reviewed today include:  Recent Labs:   Lab Results  Component Value Date   CHOL 168 01/17/2014   HDL 63 01/17/2014   LDLCALC 89 01/17/2014   TRIG 78 01/17/2014   CHOLHDL 2.7 01/17/2014    ASSESSMENT / PLAN: Problem List Items Addressed This Visit    Hyperlipidemia with target LDL less than 70 (Chronic)    She is on statin. She is due for lipid Labs to be rechecked. Will not be too aggressive with management.      Relevant Medications   amLODipine (NORVASC) 5 MG tablet   Other Relevant Orders   EKG 12-Lead (Completed)   Lipid panel   Essential hypertension (Chronic)    Borderline hypotensive today. I thought she had been converted from an ACE inhibitor to an ARB, however she has quinapril listed below. No longer taking metoprolol. Reduce amlodipine dose to 5 mg.      Relevant Medications   amLODipine (NORVASC) 5 MG tablet   Other Relevant Orders   EKG 12-Lead (Completed)   Lipid panel   DOE (dyspnea on exertion) (Chronic)    Again pre-much at her baseline. She has underlying lung disease on home oxygen. No anginal symptoms, PND orthopnea to suggest a cardiac component..      Relevant Orders   EKG 12-Lead (Completed)   Lipid panel   CAD S/P percutaneous coronary angioplasty - Primary (Chronic)    No more active anginal chest pain. Converted from Brilinta to Plavix. On statin With her bruising, we can stop aspirin. She is otherwise on ACE inhibitor without beta blocker - presumably based on her pulmonary symptoms and borderline blood pressures.      Relevant Medications    amLODipine (NORVASC) 5 MG  tablet   Other Relevant Orders   EKG 12-Lead (Completed)   Lipid panel      Current medicines are reviewed at length with the patient today. (+/- concerns) bruising - on Brilinta.  She cut metoprolol to one tablet daily The following changes have been made:    DECREASE AMLODIPINE TO  5 MG  ONE  TABLET  DAILY   STOP ASPIRIN  Your physician wants you to follow-up in  6 months with DR   HARDING.  LIPID - LABS SLIP  DO NOT EAT OR DRINK  THE MORNING OF THE TEST.   Studies Ordered:   Orders Placed This Encounter  Procedures  . Lipid panel  . EKG 12-Lead      Leonie Man, M.D., M.S. Interventional Cardiologist   Pager # [phone]

## 2015-05-12 ENCOUNTER — Encounter: Payer: Self-pay | Admitting: Cardiology

## 2015-05-12 NOTE — Assessment & Plan Note (Signed)
Borderline hypotensive today. I thought she had been converted from an ACE inhibitor to an ARB, however she has quinapril listed below. No longer taking metoprolol. Reduce amlodipine dose to 5 mg.

## 2015-05-12 NOTE — Assessment & Plan Note (Signed)
No more active anginal chest pain. Converted from Brilinta to Plavix. On statin With her bruising, we can stop aspirin. She is otherwise on ACE inhibitor without beta blocker - presumably based on her pulmonary symptoms and borderline blood pressures.

## 2015-05-12 NOTE — Assessment & Plan Note (Addendum)
She is on statin. She is due for lipid Labs to be rechecked. Will not be too aggressive with management.

## 2015-05-12 NOTE — Assessment & Plan Note (Signed)
Again pre-much at her baseline. She has underlying lung disease on home oxygen. No anginal symptoms, PND orthopnea to suggest a cardiac component.Marland Kitchen

## 2015-05-16 LAB — LIPID PANEL
CHOL/HDL RATIO: 2.1 ratio (ref <=5.0)
CHOLESTEROL: 140 mg/dL (ref 125–200)
HDL: 66 mg/dL (ref >=46)
LDL Cholesterol: 61 mg/dL (ref <130)
TRIGLYCERIDES: 67 mg/dL (ref <150)
VLDL: 13 mg/dL (ref <30)

## 2015-11-26 ENCOUNTER — Encounter: Payer: Self-pay | Admitting: Cardiology

## 2015-11-26 ENCOUNTER — Ambulatory Visit (INDEPENDENT_AMBULATORY_CARE_PROVIDER_SITE_OTHER): Payer: Medicare Other | Admitting: Cardiology

## 2015-11-26 ENCOUNTER — Other Ambulatory Visit: Payer: Self-pay | Admitting: Cardiology

## 2015-11-26 VITALS — BP 102/54 | HR 74 | Ht 62.0 in | Wt 162.5 lb

## 2015-11-26 DIAGNOSIS — I2510 Atherosclerotic heart disease of native coronary artery without angina pectoris: Secondary | ICD-10-CM | POA: Diagnosis not present

## 2015-11-26 DIAGNOSIS — I10 Essential (primary) hypertension: Secondary | ICD-10-CM | POA: Diagnosis not present

## 2015-11-26 DIAGNOSIS — I739 Peripheral vascular disease, unspecified: Secondary | ICD-10-CM

## 2015-11-26 DIAGNOSIS — R0609 Other forms of dyspnea: Secondary | ICD-10-CM

## 2015-11-26 DIAGNOSIS — Z9861 Coronary angioplasty status: Secondary | ICD-10-CM

## 2015-11-26 DIAGNOSIS — E785 Hyperlipidemia, unspecified: Secondary | ICD-10-CM | POA: Diagnosis not present

## 2015-11-26 DIAGNOSIS — M79605 Pain in left leg: Secondary | ICD-10-CM

## 2015-11-26 DIAGNOSIS — M79604 Pain in right leg: Secondary | ICD-10-CM | POA: Diagnosis not present

## 2015-11-26 DIAGNOSIS — J849 Interstitial pulmonary disease, unspecified: Secondary | ICD-10-CM

## 2015-11-26 NOTE — Progress Notes (Signed)
PCP: Mayra Neer, MD  Clinic Note: Chief Complaint  Patient presents with  . Follow-up    haing some  cramping & pain in legs, has some shortness of breath on exertion  . Coronary Artery Disease  Problem List 1. Essential hypertension   2. CAD S/P percutaneous coronary angioplasty   3. Hyperlipidemia with target LDL less than 70   4. Leg pain, bilateral   5. ILD (interstitial lung disease) (HCC)     HPI: Christine Pratt is a 82 y.o. female with a PMH below who presents today for ~6-7 month f/u CAD-PCI of LAD. Initial cath for Class III angina/DOE in Nov 2015 --> DES to LAD (FFR guided) She has baseline dyspnea from Interstitial Lung Disease (ILD) - wears home O2 @ 4L. Mild PHTN on RHC  [name] was last seen on February 2017-- she was doing relatively well. Noticed some bruising. No some fatigue. Noticed sig caring for her daughter was tiring her out.  Stable dyspnea. Does not do much walking b/c Back & Hip/Leg pain -- does get DOE with "overdoing it".   Doing less b/c wearing herself out caring for Dtr who had Guillan-Barre' after hip replacement. plan was to transition from Brilinta to Plavix, but never got the Rx - now on Plavix. -- ASA was stopped DECREASE AMLODIPINE TO  5 MG  ONE  TABLET  DAILY   She also stopped Metoprolol  Recent Hospitalizations: n/a  Studies Reviewed: no new studies  Interval History: She presents with noted c/o bruising & fatigue.  She alsothat her "legs hurt all the time ". Not as much notable with a walker. She describes these symptoms as a cramping type pain. Actually walking around with a walker a little bit here and there does help some. A lot of her activity is limited by swollen and painful knees.  She has a very positive review of systems, but denies any chest tightness or pressure with rest or exertion. She has chronic dyspnea that seems relatively stable.  She was worried about some bruising and being short of breath so she decided to  take up back on medicine. She initially she could not stop the Brilinta, so she cut the other twice a day and driving which apparently is Lopressor. She is now taking one pill once a day status post twice a day. Otherwise cardiac standpoint she has not had any anginal chest tightness or pressure with rest or exertion. She has a baseline dyspnea and hasn't noted that being worse. She has mild swelling at the end of the day, but most notable thing is that she has been just more fatigued and tired than usual -- although this is what she complained of last time she was here as well..  Cardiac ROS. No chest pain /pressure with rest or exertion.  No PND, orthopnea -- late in the day,she may get some mild swelling.Marland Kitchen  No palpitations, lightheadedness, syncope/near syncope. No TIA/amaurosis fugax symptoms. No melena, hematochezia, hematuria, or epstaxis (occasional mild pink twinge, but no bleed), ++ easy bruising No claudication   ROS: A comprehensive was performed.  Has not had a chance to do her necessary exercise & monitor her diet - too much time taking care of daughter. Review of Systems  Constitutional: Positive for malaise/fatigue.  HENT: Positive for nosebleeds (occasionally 2/2 O2 cannula makes the nares raw - no real bleed.).   Respiratory: Positive for cough (Occasional), shortness of breath (stable dyspnea from ILD) and wheezing. Negative for  sputum production.   Cardiovascular: Negative for claudication and leg swelling.  Gastrointestinal: Positive for constipation (chronic.). Negative for blood in stool and melena.  Genitourinary: Positive for dysuria (Chronic dysuria - difficult ot get started). Negative for hematuria.  Musculoskeletal: Positive for back pain, joint pain (Hips; & L knee) and myalgias (diffuse bilateral leg pain. -- cramping .  hurt all the time). Negative for falls.  Neurological: Negative for dizziness (A lot less dizzy since reducing her medications.), focal weakness,  loss of consciousness and headaches.       Sciatica pain - R > L; stocking feet sensation bilaterally  Endo/Heme/Allergies: Bruises/bleeds easily.  Psychiatric/Behavioral: Positive for depression and memory loss. The patient is nervous/anxious (lots of stress) and has insomnia (takes a sleep aid -- ativan;  taking longer over last few weeks.).   All other systems reviewed and are negative.  Past Medical History:  Diagnosis Date  . CAD S/P percutaneous coronary angioplasty 01/16/2014   mLAD 70-80% (+ FFR)--> XIENCE ALPINE DES 2.25 MM X 18 MM --> 2.4 MM.  . CKD (chronic kidney disease), stage III   . Depression   . DJD (degenerative joint disease)   . Essential hypertension   . Osteoarthritis   . Pulmonary fibrosis (HCC)    Interstitial lung disease on home oxygen; mild to moderate pulmonary hypertension by RHC (PAP 40/18 mmHg)  . Pulmonary nodule   . Spinal stenosis     Past Surgical History:  Procedure Laterality Date  . APPENDECTOMY    . BACK SURGERY    . LEFT AND RIGHT HEART CATHETERIZATION WITH CORONARY ANGIOGRAM N/A 01/16/2014   L & R HCATH W/ COR ANGIO; Leonie Man, MD;  [facility] CATH LAB; 70-80% mid LAD --> PCI, 50% mid RCA, PAP 40/18 mmHg, PCWP 12 mmHg  . PERCUTANEOUS CORONARY STENT INTERVENTION (PCI-S)  01/16/2014   mLAD - XIENCE ALPINE DES 2.25 MM X 18 MM --> 2.4 MM.  Marland Kitchen TRANSTHORACIC ECHOCARDIOGRAM  12/06/2013    Mild LVH. Vigorous function with EF 65-70%. No regional WMA, high LV filling pressures, MAC with mld MR.  . TUBAL LIGATION      Prior to Admission medications   Medication Sig Start Date End Date Taking? Authorizing Provider  amLODipine (NORVASC) 5 MG tablet Take 1 tablet (5 mg total) by mouth daily. 05/09/15  Yes Leonie Man, MD  atorvastatin (LIPITOR) 20 MG tablet Take 1 tablet (20 mg total) by mouth daily. 01/17/14  Yes Rhonda G Barrett, PA-C  clopidogrel (PLAVIX) 75 MG tablet TAKE 4 TABLET FOR THE FIRST DAY THEN TAKE 1 TABLET DAILY. 11/15/14  Yes Leonie Man, MD  escitalopram (LEXAPRO) 20 MG tablet Take 20 mg by mouth daily.    Yes Historical Provider, MD  furosemide (LASIX) 20 MG tablet Take 20 mg by mouth daily.    Yes Historical Provider, MD  LORazepam (ATIVAN) 1 MG tablet Take 1 mg by mouth daily.    Yes Historical Provider, MD  nitroGLYCERIN (NITROSTAT) 0.4 MG SL tablet Place 1 tablet (0.4 mg total) under the tongue every 5 (five) minutes as needed for chest pain. 02/01/14  Yes Brittainy Erie Noe, PA-C  Oxycodone HCl 20 MG TABS Take 20 mg by mouth 3 (three) times daily.    Yes Historical Provider, MD  Polyethylene Glycol 3350 (MIRALAX PO) Take 17 g by mouth daily as needed (for constipation).    Yes Historical Provider, MD  polyethylene glycol powder (GLYCOLAX/MIRALAX) powder  02/13/14  Yes Historical Provider, MD  quinapril (ACCUPRIL) 20 MG tablet Take 1 tablet by mouth daily.   Yes Historical Provider, MD    No Known Allergies   Social History   Social History  . Marital status: Widowed    Spouse name: N/A  . Number of children: N/A  . Years of education: N/A   Occupational History  . Retired    Social History Main Topics  . Smoking status: Never Smoker  . Smokeless tobacco: Never Used  . Alcohol use No  . Drug use: No  . Sexual activity: Not Asked   Other Topics Concern  . None   Social History Narrative   Retired. Widowed.   Never smoked and does not drink alcohol.   Family History  Problem Relation Age of Onset  . Heart failure Sister   . Cancer Mother     kidney  . Heart attack Father   . Heart disease Brother     Wt Readings from Last 3 Encounters:  11/26/15 162 lb 8 oz (73.7 kg)  05/09/15 164 lb 14.4 oz (74.8 kg)  11/15/14 160 lb 12.8 oz (72.9 kg)    PHYSICAL EXAM BP (!) 102/54 (BP Location: Left Arm, Patient Position: Sitting, Cuff Size: Normal)   Pulse 74   Ht _0  (1.575 m)   Wt 162 lb 8 oz (73.7 kg)   BMI 29.72 kg/m  -- @ home SBP usually 130s. General appearance:  frail elderly woman  on O2 by [place]; alert, cooperative, appears stated age, no distress and mildly obese; Neck: no adenopathy, no carotid bruit and minimal JVD Lungs: Diffuse mild late inspiratory wheezes. No rales but intermittent crackles noted., normal percussion bilaterally and non-labored; is on oxygen at 2 L per nasal cannula Heart: regular rate and rhythm, S1& S2 normal, no murmur, click, rub or gallop; nondisplaced PMI Abdomen: soft, non-tender; bowel sounds normal; no masses, no organomegaly;  Extremities: extremities normal, atraumatic, no cyanosis, or edema Pulses: 2+ and symmetric;\ Neurologic: Mental status: Alert, oriented, thought content appropriate Cranial nerves: normal (II-XII grossly intact)   Adult ECG Report  Rate:  75;  Rhythm: normal sinus rhythm and 1 AVB (PR interval 206) Otherwise normal axis, intervals and durations.;   Narrative Interpretation: Normal EKG . No significant change  Other studies Reviewed: Additional studies/ records that were reviewed today include:  Recent Labs:   Lab Results  Component Value Date   CHOL 140 05/15/2015   HDL 66 05/15/2015   LDLCALC 61 05/15/2015   TRIG 67 05/15/2015   CHOLHDL 2.1 05/15/2015    ASSESSMENT / PLAN: Problem List Items Addressed This Visit    Leg pain, bilateral    I think this could very well be related to her statin. The have her hold statin for 2 months to see if symptoms improve. If they do improve, would switch her to pravastatin. If no improvement, would restart atorvastatin. We'll check ABIs plus minus Lotrimin Dopplers to evaluate for PAD. She has somewhat weak pulses in her feet, but are palpable.      ILD (interstitial lung disease) ([place]) (Chronic)    Followed by pulmonary medicine. She is on home oxygen. The main cause of her dyspnea.      Hyperlipidemia with target LDL less than 70 (Chronic)    Currently on statin with pretty well-controlled lipids based on check in March. She is due for lipid panel. With her  leg aching and cramps, went to have her hold Lipitor for about 2 months. If no change  in her leg discomfort, she can restart. If it does improve, then we would likely change her to pravastatin low-dose.      Relevant Orders   Lipid panel   Essential hypertension - Primary (Chronic)    Actually borderline hypotensive still. Continue ACE inhibitor current dose. If she is dizzy, 1 going hold amlodipine. We may family stop amlodipine.      Relevant Orders   EKG 12-Lead (Completed)   CAD S/P percutaneous coronary angioplasty (Chronic)    No more active anginal symptoms. Stable now on Plavix alone without aspirin. Currently on statin, we will hold due to her leg cramping. She is on an ACE inhibitor without beta blocker based on her borderline blood pressures and pulmonary issues. She is on amlodipine as much her blood pressure as for her anti-angina coverage.       Other Visit Diagnoses   None.     Current medicines are reviewed at length with the patient today. (+/- concerns)   The following have been made:  Medications:  --Hold Atorvastatin for 2 months. If leg pain improves, then do not take until your appointment with Dr. Ellyn Hack. If leg pain does not improve, then start taking again.  --If you start feeling dizzy, do not take your amlodipine.  Labwork: Your physician recommends that you return for a FASTING lipid profile. Studies Ordered:   Orders Placed This Encounter  Procedures  . Lipid panel  . EKG 12-Lead   Procedures/Testing: Your physician has requested that you have an ankle brachial index (ABI).   Follow-Up: 6 months with Dr. Ellyn Hack.    Glenetta Hew, M.D., M.S. Interventional Cardiologist   Pager # [phone]

## 2015-11-26 NOTE — Patient Instructions (Signed)
Medications:  --Hold Atorvastatin for 2 months. If leg pain improves, then do not take until your appointment with Dr. Ellyn Hack. If leg pain does not improve, then start taking again.  --If you start feeling dizzy, do not take your amlodipine.    Labwork:  Your physician recommends that you return for a FASTING lipid profile.   Procedures/Testing:  Your physician has requested that you have an ankle brachial index (ABI). During this test an ultrasound and blood pressure cuff are used to evaluate the arteries that supply the arms and legs with blood. Allow thirty minutes for this exam. There are no restrictions or special instructions.   Follow-Up:  Your physician wants you to follow-up in: 6 months with Dr. Ellyn Hack. You will receive a reminder letter in the mail two months in advance. If you don't receive a letter, please call our office to schedule the follow-up appointment. If you need a refill on your cardiac medications before your next appointment, please call your pharmacy.

## 2015-12-02 ENCOUNTER — Encounter: Payer: Self-pay | Admitting: Cardiology

## 2015-12-02 DIAGNOSIS — M79605 Pain in left leg: Secondary | ICD-10-CM | POA: Insufficient documentation

## 2015-12-02 DIAGNOSIS — M79604 Pain in right leg: Secondary | ICD-10-CM | POA: Insufficient documentation

## 2015-12-02 NOTE — Assessment & Plan Note (Addendum)
No more active anginal symptoms. Stable now on Plavix alone without aspirin. Currently on statin, we will hold due to her leg cramping. She is on an ACE inhibitor without beta blocker based on her borderline blood pressures and pulmonary issues. She is on amlodipine as much her blood pressure as for her anti-angina coverage.

## 2015-12-02 NOTE — Assessment & Plan Note (Signed)
Actually borderline hypotensive still. Continue ACE inhibitor current dose. If she is dizzy, 1 going hold amlodipine. We may family stop amlodipine.

## 2015-12-02 NOTE — Assessment & Plan Note (Signed)
Followed by pulmonary medicine. She is on home oxygen. The main cause of her dyspnea.

## 2015-12-02 NOTE — Assessment & Plan Note (Addendum)
Currently on statin with pretty well-controlled lipids based on check in March. She is due for lipid panel. With her leg aching and cramps, went to have her hold Lipitor for about 2 months. If no change in her leg discomfort, she can restart. If it does improve, then we would likely change her to pravastatin low-dose.

## 2015-12-02 NOTE — Assessment & Plan Note (Signed)
I think this could very well be related to her statin. The have her hold statin for 2 months to see if symptoms improve. If they do improve, would switch her to pravastatin. If no improvement, would restart atorvastatin. We'll check ABIs plus minus Lotrimin Dopplers to evaluate for PAD. She has somewhat weak pulses in her feet, but are palpable.

## 2015-12-05 ENCOUNTER — Ambulatory Visit (HOSPITAL_COMMUNITY)
Admission: RE | Admit: 2015-12-05 | Discharge: 2015-12-05 | Disposition: A | Discharge status: Home / Self Care | Payer: Medicare Other | Source: Ambulatory Visit | Attending: Cardiology | Admitting: Cardiology

## 2015-12-05 DIAGNOSIS — I739 Peripheral vascular disease, unspecified: Secondary | ICD-10-CM | POA: Insufficient documentation

## 2016-04-16 HISTORY — PX: NM MYOVIEW LTD: HXRAD82

## 2016-04-30 ENCOUNTER — Ambulatory Visit (INDEPENDENT_AMBULATORY_CARE_PROVIDER_SITE_OTHER): Payer: Medicare Other | Admitting: Nurse Practitioner

## 2016-04-30 ENCOUNTER — Encounter: Payer: Self-pay | Admitting: Nurse Practitioner

## 2016-04-30 VITALS — BP 170/86 | HR 74 | Ht 63.0 in | Wt 161.0 lb

## 2016-04-30 DIAGNOSIS — I10 Essential (primary) hypertension: Secondary | ICD-10-CM

## 2016-04-30 DIAGNOSIS — I25110 Atherosclerotic heart disease of native coronary artery with unstable angina pectoris: Secondary | ICD-10-CM | POA: Diagnosis not present

## 2016-04-30 DIAGNOSIS — I200 Unstable angina: Secondary | ICD-10-CM | POA: Diagnosis not present

## 2016-04-30 DIAGNOSIS — E782 Mixed hyperlipidemia: Secondary | ICD-10-CM | POA: Diagnosis not present

## 2016-04-30 MED ORDER — ATORVASTATIN CALCIUM 20 MG PO TABS: 10.0000 mg | ORAL_TABLET | Freq: Every day | ORAL | 3 refills | Status: DC

## 2016-04-30 NOTE — Patient Instructions (Signed)
Medication Instructions:  Your physician recommends that you continue on your current medications as directed. Please refer to the Current Medication list given to you today.  Labwork: NONE  Testing/Procedures: Your physician has requested that you have a lexiscan myoview. For further information please visit [email or web address]. Please follow instruction sheet, as given.   Follow-Up: Your physician recommends that you schedule a follow-up appointment in: 2 weeks with Ignacia Bayley NP or Dr. Ellyn Hack.   Any Other Special Instructions Will Be Listed Below (If Applicable).     If you need a refill on your cardiac medications before your next appointment, please call your pharmacy.

## 2016-04-30 NOTE — Progress Notes (Signed)
Office Visit    Patient Name: Christine Pratt Date of Encounter: 04/30/2016  Primary Care Provider:  Mayra Neer, MD Primary Cardiologist:  Roni Bread, MD   Chief Complaint    83 year old female with a history of coronary artery disease, interstitial lung disease, hypertension, hyperlipidemia, arthritis, and stage III kidney disease, who presents for follow-up related to intermittent chest discomfort, dizziness, and progressively worsening exercise tolerance.  Past Medical History    Past Medical History:  Diagnosis Date  . CAD S/P percutaneous coronary angioplasty 01/16/2014   a. 01/2014 Cath/PCI: LM nl, LAD 50ost, 70-75m(+ FFR--> XIENCE ALPINE DES 2.25 MM X 18 MM --> 2.4 MM:, LCX min irregs, OM1 nl, RCA 20p, 50-643mmin irregs distal, RPDA/RPAV nl.  . CKD (chronic kidney disease), stage III   . Claudication ([place]   a. 11/2015 ABI's: R 1.25, L1.1  . Depression   . DJD (degenerative joint disease)   . Essential hypertension   . Osteoarthritis   . Pulmonary fibrosis (HCC)    Interstitial lung disease on home oxygen; mild to moderate pulmonary hypertension by RHC (PAP 40/18 mmHg)  . Pulmonary nodule   . Spinal stenosis    Past Surgical History:  Procedure Laterality Date  . APPENDECTOMY    . BACK SURGERY    . LEFT AND RIGHT HEART CATHETERIZATION WITH CORONARY ANGIOGRAM N/A 01/16/2014   L & [facility] HCATH W/ COR ANGIO; [facility]; 70-80% mid LAD --> PCI, 50% mid RCA, PAP 40/18 mmHg, PCWP 12 mmHg  . PERCUTANEOUS CORONARY STENT INTERVENTION (PCI-S)  01/16/2014   mLAD - XIENCE ALPINE DES 2.25 MM X 18 MM --> 2.4 MM.  . Marland KitchenRANSTHORACIC ECHOCARDIOGRAM  12/06/2013    Mild LVH. Vigorous function with EF 65-70%. No regional WMA, high LV filling pressures, MAC with mld MR.  . TUBAL LIGATION      Allergies  No Known Allergies  History of Present Illness    [age]ear old female with the above complex past medical history including coronary artery disease status post  drug-eluting stent placement to the mid LAD in November 2015. Other history includes pulmonary fibrosis/interstitial lung disease on chronic supplemental oxygen via nasal cannula, hypertension, hyperlipidemia, depression, arthritis, degenerative joint disease, and stage III chronic kidney disease.  She was last seen in clinic in 11/2015 @ which time she was having some leg pain. Statin was d/c'd and ABI's were performed.  ABI's were nl.  Ss resolved off of statin.  BP was soft that day and she was advised to hold amlodipine if bp's were low @ home.  Finally, b/c of bruising, it was noted that she was on plavix and no longer taking aspirin.  She tells me today however, that she was always on ASA and actually stopped taking plavix in 12/2015, after that last visit (she is now > 2 yrs since LAD PCI).  She has chronic DOE in the setting of interstitial lung disease requiring supplemental oxygen but over the past 2 months, she has noted further decline in exercise tolerance.  Performing simple activities around the house often causes her to feel dizzy, fatigued, dyspneic, and occasionally she notes sscp/discomfort.  Ss will last up to 15-20 mins and resolve only once she's rested. She has not been having any PND, orthopnea, syncope, edema, or early satiety.  Home Medications    Prior to Admission medications   Medication Sig Start Date End Date Taking? Authorizing Provider  amLODipine (NORVASC) 5 MG tablet  Take 1 tablet (5 mg total) by mouth daily. 05/09/15  Yes Leonie Man, MD  atorvastatin (LIPITOR) 20 MG tablet Take 1 tablet (20 mg total) by mouth daily. 01/17/14  Yes Rhonda G Barrett, PA-C  escitalopram (LEXAPRO) 20 MG tablet Take 20 mg by mouth daily.    Yes Historical Provider, MD  furosemide (LASIX) 20 MG tablet Take 20 mg by mouth daily.    Yes Historical Provider, MD  LORazepam (ATIVAN) 1 MG tablet Take 1 mg by mouth daily.    Yes Historical Provider, MD  nitroGLYCERIN (NITROSTAT) 0.4 MG SL tablet  Place 1 tablet (0.4 mg total) under the tongue every 5 (five) minutes as needed for chest pain. 02/01/14  Yes Brittainy Erie Noe, PA-C  Oxycodone HCl 20 MG TABS Take 20 mg by mouth 3 (three) times daily.    Yes Historical Provider, MD  Polyethylene Glycol 3350 (MIRALAX PO) Take 17 g by mouth daily as needed (for constipation).    Yes Historical Provider, MD  quinapril (ACCUPRIL) 20 MG tablet Take 1 tablet by mouth daily.   Yes Historical Provider, MD    Review of Systems    As above, she has been experiencing intermittent dizziness, rest and exertional chest discomfort, and worsening exercise tolerance. She has chronic dyspnea on exertion.  All other systems reviewed and are otherwise negative except as noted above.  Physical Exam    VS:  BP (!) 170/86 (Cuff Size: Normal)   Pulse 74   Ht _0  (1.6 m)   Wt 161 lb (73 kg)   BMI 28.52 kg/m  , BMI Body mass index is 28.52 kg/m.  Blood pressure lying 140/82, heart rate 62 Blood pressure sitting 146/76, heart rate 64 Blood pressure standing 144/74, heart rate 68 GEN: Well nourished, well developed, in no acute distress.  HEENT: normal.  Neck: Supple, no JVD, carotid bruits, or masses. Cardiac: RRR, Distant, no murmurs, rubs, or gallops. No clubbing, cyanosis, edema.  Radials/DP/PT 2+ and equal bilaterally.  Respiratory:  Respirations regular and unlabored, bibasilar crackles with diminished breath sounds bilaterally. Bilateral inspiratory 3 wheezing.  GI: Soft, nontender, nondistended, BS + x 4. MS: no deformity or atrophy. Skin: warm and dry, no rash. Neuro:  Strength and sensation are intact. Psych: Normal affect.  Accessory Clinical Findings    ECG - Sinus rhythm, 67, first-degree AV block, right axis deviation prior anterior infarct. No acute changes.  Assessment & Plan    1.  Unstable angina/coronary artery disease: Patient presents with a two-month history of worsening exercise tolerance and occasional chest discomfort that  may occur with exertion or when lying down. She has prior history of CAD status post LAD stenting in October 2015. She had moderate residual right coronary artery disease at that time. ECG is without acute changes today. Symptoms are somewhat difficult to tease out in the setting of chronic dyspnea and pulmonary fibrosis. That said, in the setting of new complaints of chest discomfort, I will arrange for a Lexiscan Myoview to rule out ischemia. She will remain on aspirin and ACE inhibitor therapy. Statin was discontinued in the fall secondary to leg aches, which have more or less resolved. She was taking atorvastatin 20 mg at that time. We talked about trying an alternate statin versus a lower dose of atorvastatin. She prefers to take a lower dose of atorvastatin. She still has 29 tablets at home and will cut these in half and see if she tolerates them.  2. Essential hypertension: Blood  pressure was elevated at 170/86 today. She is not entirely sure what it was running at home but thinks it's mostly been in the 130s. It's not clear to what extent she has been taking her amlodipine at home. She had previously been advised to hold it if she is feeling dizzy and she is not sure she's been holding her taking it. I did check orthostatics and these were normal. She remains on Accupril and will resume amlodipine with a plan to hold amlodipine if she is feeling dizzy on a particular day or if systolic pressures are less than 120.  3. Lightheadedness: Patient with intermittent lightheadedness. Orthostatics were normal. I also had our staff walk her around and check her oxygen saturation. O2 at rest was 91% but dropped to 84% with ambulation. I did recommend she follow up with pulmonology though I suspect that this is likely her baseline.  4. Hyperlipidemia: LDL was 61 in March 2017. She was on statin therapy at that time but this was discontinued in the setting of leg aches in September. Myalgias have resolved. She  will resume Lipitor 10 mg daily.  5. Disposition: Follow-up stress testing as outlined above. Follow-up in clinic in approximately 2 weeks and  Murray Hodgkins, NP 04/30/2016, 2:45 PM

## 2016-05-01 ENCOUNTER — Other Ambulatory Visit: Payer: Self-pay | Admitting: Cardiology

## 2016-05-01 NOTE — Addendum Note (Signed)
Addended by: Patria Mane A on: 05/01/2016 04:41 PM   Modules accepted: Orders

## 2016-05-06 ENCOUNTER — Telehealth (HOSPITAL_COMMUNITY): Payer: Self-pay

## 2016-05-06 NOTE — Telephone Encounter (Signed)
Encounter complete. 

## 2016-05-08 ENCOUNTER — Ambulatory Visit (HOSPITAL_COMMUNITY)
Admission: RE | Admit: 2016-05-08 | Discharge: 2016-05-08 | Disposition: A | Discharge status: Home / Self Care | Payer: Medicare Other | Source: Ambulatory Visit | Attending: Cardiology | Admitting: Cardiology

## 2016-05-08 DIAGNOSIS — I200 Unstable angina: Secondary | ICD-10-CM

## 2016-05-08 DIAGNOSIS — I10 Essential (primary) hypertension: Secondary | ICD-10-CM | POA: Insufficient documentation

## 2016-05-08 LAB — MYOCARDIAL PERFUSION IMAGING
CHL CUP NUCLEAR SDS: 1
CHL CUP NUCLEAR SSS: 1
LV dias vol: 50 mL (ref 46–106)
LV sys vol: 13 mL
NUC STRESS TID: 1.05
Peak HR: 88 {beats}/min
Rest HR: 80 {beats}/min
SRS: 0

## 2016-05-08 MED ORDER — AMINOPHYLLINE 25 MG/ML IV SOLN
75.0000 mg | Freq: Once | INTRAVENOUS | Status: AC
  Administered 2016-05-08: 75 mg via INTRAVENOUS

## 2016-05-08 MED ORDER — TECHNETIUM TC 99M TETROFOSMIN IV KIT
10.3000 | PACK | Freq: Once | INTRAVENOUS | Status: AC | PRN
  Administered 2016-05-08: 10.3 via INTRAVENOUS
  Filled 2016-05-08: qty 11

## 2016-05-08 MED ORDER — REGADENOSON 0.4 MG/5ML IV SOLN
0.4000 mg | Freq: Once | INTRAVENOUS | Status: AC
  Administered 2016-05-08: 0.4 mg via INTRAVENOUS

## 2016-05-08 MED ORDER — TECHNETIUM TC 99M TETROFOSMIN IV KIT
31.4000 | PACK | Freq: Once | INTRAVENOUS | Status: AC | PRN
  Administered 2016-05-08: 31.4 via INTRAVENOUS
  Filled 2016-05-08: qty 32

## 2016-05-14 ENCOUNTER — Encounter: Payer: Self-pay | Admitting: Nurse Practitioner

## 2016-05-14 ENCOUNTER — Ambulatory Visit (INDEPENDENT_AMBULATORY_CARE_PROVIDER_SITE_OTHER): Payer: Medicare Other | Admitting: Nurse Practitioner

## 2016-05-14 VITALS — BP 107/62 | HR 81 | Ht 63.0 in | Wt 162.8 lb

## 2016-05-14 DIAGNOSIS — I25119 Atherosclerotic heart disease of native coronary artery with unspecified angina pectoris: Secondary | ICD-10-CM

## 2016-05-14 DIAGNOSIS — E782 Mixed hyperlipidemia: Secondary | ICD-10-CM | POA: Diagnosis not present

## 2016-05-14 DIAGNOSIS — R5381 Other malaise: Secondary | ICD-10-CM

## 2016-05-14 DIAGNOSIS — I10 Essential (primary) hypertension: Secondary | ICD-10-CM

## 2016-05-14 NOTE — Patient Instructions (Signed)
You have been referred to physical therapy for deconditioning.  Your physician recommends that you schedule a follow-up appointment in: 3 months with Dr Ellyn Hack.

## 2016-05-14 NOTE — Progress Notes (Signed)
Office Visit    Patient Name: Christine Pratt Date of Encounter: 05/14/2016  Primary Care Provider:  Mayra Neer, MD Primary Cardiologist:  Roni Bread, MD   Chief Complaint    83 year old female with history of CAD, interstitial lung disease, hypertension, hyperlipidemia, arthritis, and stage III kidney disease who presents for follow-up after recent stress testing.  Past Medical History    Past Medical History:  Diagnosis Date  . CAD S/P percutaneous coronary angioplasty 01/16/2014   a. 01/2014 Cath/PCI: LM nl, LAD 50ost, 70-16m(+ FFR--> XIENCE ALPINE DES 2.25 MM X 18 MM --> 2.4 MM, LCX min irregs, OM1 nl, RCA 20p, 50-660mmin irregs distal, RPDA/RPAV nl;  b. 04/2016 Lexiscan MV: EF 73%, no ischemia/infarct.  . CKD (chronic kidney disease), stage III   . Claudication ([place]   a. 11/2015 ABI's: R 1.25, L1.1  . Depression   . DJD (degenerative joint disease)   . Essential hypertension   . Osteoarthritis   . Pulmonary fibrosis (HCC)    Interstitial lung disease on home oxygen; mild to moderate pulmonary hypertension by RHC (PAP 40/18 mmHg)  . Pulmonary nodule   . Spinal stenosis    Past Surgical History:  Procedure Laterality Date  . APPENDECTOMY    . BACK SURGERY    . LEFT AND RIGHT HEART CATHETERIZATION WITH CORONARY ANGIOGRAM N/A 01/16/2014   L & R HCATH W/ COR ANGIO; [facility]  [facility]; 70-80% mid LAD --> PCI, 50% mid RCA, PAP 40/18 mmHg, PCWP 12 mmHg  . PERCUTANEOUS CORONARY STENT INTERVENTION (PCI-S)  01/16/2014   mLAD - XIENCE ALPINE DES 2.25 MM X 18 MM --> 2.4 MM.  . Marland KitchenRANSTHORACIC ECHOCARDIOGRAM  12/06/2013    Mild LVH. Vigorous function with EF 65-70%. No regional WMA, high LV filling pressures, MAC with mld MR.  . TUBAL LIGATION      Allergies  No Known Allergies  History of Present Illness    89ear old female with the above complex past medical history including coronary artery disease status post LAD stenting in November 2015. She also has  a history of pulmonary fibrosis/interstitial lung disease on chronic supplemental oxygen, hypertension, hyperlipidemia, depression, arthritis, degenerative joint disease, stage III kidney disease, and also significant deconditioning. I saw her in clinic on February 15 at which time she complained of reduced exercise tolerance with more pronounced fatigue, dyspnea, and dizziness. She also reported occasional chest discomfort.  She underwent an exercise myoview which showed normal LV function and no evidence of ischemia.  Since her last visit, she remains dyspneic with activity and also fatigues easily. She has not had much chest pain. She has not yet set up an appointment to see pulmonology. She denies PND, orthopnea, dizziness, syncope, edema, or early satiety.  Home Medications    Prior to Admission medications   Medication Sig Start Date End Date Taking? Authorizing Provider  amLODipine (NORVASC) 5 MG tablet take 1 tablet by mouth once daily 05/04/16  Yes DaLeonie ManMD  atorvastatin (LIPITOR) 20 MG tablet Take 0.5 tablets (10 mg total) by mouth daily. 04/30/16 07/29/16 Yes ChRogelia MireNP  escitalopram (LEXAPRO) 20 MG tablet Take 20 mg by mouth daily.    Yes Historical Provider, MD  furosemide (LASIX) 20 MG tablet Take 20 mg by mouth daily.    Yes Historical Provider, MD  LORazepam (ATIVAN) 1 MG tablet Take 1 mg by mouth daily.    Yes Historical Provider, MD  nitroGLYCERIN (NITROSTAT)  0.4 MG SL tablet Place 1 tablet (0.4 mg total) under the tongue every 5 (five) minutes as needed for chest pain. 02/01/14  Yes Brittainy Erie Noe, PA-C  Oxycodone HCl 20 MG TABS Take 20 mg by mouth 3 (three) times daily.    Yes Historical Provider, MD  Polyethylene Glycol 3350 (MIRALAX PO) Take 17 g by mouth daily as needed (for constipation).    Yes Historical Provider, MD  quinapril (ACCUPRIL) 20 MG tablet Take 1 tablet by mouth daily.   Yes Historical Provider, MD    Review of Systems    As above,  she remains deconditioned dyspnea and fatigue with minimal activity. She occasionally has chest discomfort. She has not had as much lightheadedness. All other systems reviewed and are otherwise negative except as noted above.  Physical Exam    VS:  BP 107/62   Pulse 81   Ht _0  (1.6 m)   Wt 162 lb 12.8 oz (73.8 kg)   SpO2 92%   BMI 28.84 kg/m  , BMI Body mass index is 28.84 kg/m. GEN: Well nourished, well developed, in no acute distress.  HEENT: normal.  Neck: Supple, no JVD, carotid bruits, or masses. Cardiac: RRR, no murmurs, rubs, or gallops. No clubbing, cyanosis, edema.  Radials/DP/PT 2+ and equal bilaterally.  Respiratory:  Respirations regular and unlabored, She has crackles heard throughout with bilateral inspiratory and expiratory wheezing. GI: Soft, nontender, nondistended, BS + x 4. MS: no deformity or atrophy. Skin: warm and dry, no rash. Neuro:  Strength and sensation are intact. Psych: Normal affect.  Accessory Clinical Findings    Leane Call February 23rd 2018  Nuclear stress EF: 73%. The left ventricular ejection fraction is hyperdynamic (>65%). There was no ST segment deviation noted during stress. The study is normal. This is a low risk study.  Assessment & Plan    1.  Chest pain/coronary artery disease: Ms. Arriaga complained of intermittent chest discomfort when I last saw her on February 15. She has also had more fatigue and dyspnea on exertion than what she was previously accustomed to. She underwent stress testing which was low risk and nonischemic. LV function was normal. In this setting, I would not pursue additional cardiac evaluation. I have recommended that she follow-up with pulmonology as I suspect that both deconditioning and pulmonary fibrosis are playing the major roles in her symptoms. She remains on aspirin and statin therapy.  2. Pulmonary fibrosis: As above, I suspect this is the major cause of her progressive shortness of breath and  deconditioning as well as her activity is very limited in the setting of shortness of breath. I will make a referral for outpatient physical therapy as she notes very little activity and generalized weakness. I reiterated a need to follow-up with pulmonology and she will make an appointment.  3. Deconditioning:  outpt PT referral made.  4.  Essential hypertension: Blood pressure is normal today. She has been taking all of her medicines as prescribed.  5. Hyperlipidemia: LDL was 60 05/15/2015. She has resumed Lipitor 10 mg daily. No recurrence of myalgias at this point.  6. Disposition: Patient will follow up with pulmonology and we will arrange for outpatient physical therapy. Follow-up with Dr. Ellyn Hack in 3 months or sooner if necessary.   Murray Hodgkins, NP 05/14/2016, 12:17 PM

## 2016-07-06 ENCOUNTER — Ambulatory Visit: Payer: Medicare Other | Admitting: Emergency Medicine

## 2016-07-13 ENCOUNTER — Encounter: Payer: Self-pay | Admitting: Adult Health

## 2016-07-13 ENCOUNTER — Ambulatory Visit (INDEPENDENT_AMBULATORY_CARE_PROVIDER_SITE_OTHER): Payer: Medicare Other | Admitting: Adult Health

## 2016-07-13 DIAGNOSIS — J849 Interstitial pulmonary disease, unspecified: Secondary | ICD-10-CM

## 2016-07-13 DIAGNOSIS — R0902 Hypoxemia: Secondary | ICD-10-CM | POA: Diagnosis not present

## 2016-07-13 NOTE — Addendum Note (Signed)
Addended by: Parke Poisson E on: 07/13/2016 04:38 PM   Modules accepted: Orders

## 2016-07-13 NOTE — Assessment & Plan Note (Signed)
Suspect DOE is from ILD , deconditioning . Appears to be slow progression doubt acute decompensation .  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 Reported labs ok  Will check HRCT chest to see if progressive changes .  Unsure if she could complete PFT -will hold for now .   Plan  Patient Instructions  Continue on Oxygen 4l/m .  Check CT chest .  follow up Dr. Lamonte Sakai  In 2-3 months and As needed  .

## 2016-07-13 NOTE — Progress Notes (Signed)
_0  ID: Christine Pratt, female    DOB: 10-14-1925, 83 y.o.   MRN: [id number]  Chief Complaint  Patient presents with  . Follow-up    ILD     Referring provider: Mayra Neer, MD  HPI: 83 year old female never smoker followed for ILD, UIP pattern. And lung nodule  Has CKD , CAD   TEST  normal spirometry 09/28/12.  CT chest 10/2008 >UIP pattern . , pulm nodules  CT chest 2011 stable PF, no chage in RML nodule  CT chest 2014 stable PF , no change in nodule  HRCT chest 02/2014 >UIP pattern w/ very little progression  .07/13/2016 Follow up : ILD /O2 RF  Patient presents for a follow-up for interstitial lung disease. She was last seen in 2016. Patient remains on oxygen at 4 L. Serial, CT chest showed minimum progression of ILD since 2014. Says she continues to get sob with minimal activity . She wears out very easily . Feels it has slowly been getting worse for last 1-2 years. No increased cough , wheezing or fever.  She is using incentive spirometry a couple times a day.   She was seen by cardiology in Feb with stress test  showing nml EF, low risk study.  She is on ACE inhibitor , denies increased cough .   Remains on O2 4l/m to keep O2 sats >90% . No change in o2 demands.   Labs done at PCP . Pt says  Was told they were ok .   No Known Allergies  Immunization History  Administered Date(s) Administered  . Pneumococcal Polysaccharide-23 03/16/2016    Past Medical History:  Diagnosis Date  . CAD S/P percutaneous coronary angioplasty 01/16/2014   a. 01/2014 Cath/PCI: LM nl, LAD 50ost, 70-37m(+ FFR--> XIENCE ALPINE DES 2.25 MM X 18 MM --> 2.4 MM, LCX min irregs, OM1 nl, RCA 20p, 50-663mmin irregs distal, RPDA/RPAV nl;  b. 04/2016 Lexiscan MV: EF 73%, no ischemia/infarct.  . CKD (chronic kidney disease), stage III   . Claudication ([facility]   a. 11/2015 ABI's: R 1.25, L1.1  . Depression   . DJD (degenerative joint disease)   . Essential hypertension   . Osteoarthritis     . Pulmonary fibrosis (HCC)    Interstitial lung disease on home oxygen; mild to moderate pulmonary hypertension by RHC (PAP 40/18 mmHg)  . Pulmonary nodule   . Spinal stenosis     Tobacco History: History  Smoking Status  . Never Smoker  Smokeless Tobacco  . Never Used   Counseling given: Not Answered   Outpatient Encounter Prescriptions as of 07/13/2016  Medication Sig  . amLODipine (NORVASC) 5 MG tablet take 1 tablet by mouth once daily  . atorvastatin (LIPITOR) 20 MG tablet Take 0.5 tablets (10 mg total) by mouth daily.  . Marland Kitchenscitalopram (LEXAPRO) 20 MG tablet Take 20 mg by mouth daily.   . furosemide (LASIX) 20 MG tablet Take 20 mg by mouth daily.   . Marland KitchenORazepam (ATIVAN) 1 MG tablet Take 1 mg by mouth daily.   . nitroGLYCERIN (NITROSTAT) 0.4 MG SL tablet Place 1 tablet (0.4 mg total) under the tongue every 5 (five) minutes as needed for chest pain.  . Oxycodone HCl 20 MG TABS Take 20 mg by mouth 3 (three) times daily.   . Polyethylene Glycol 3350 (MIRALAX PO) Take 17 g by mouth daily as needed (for constipation).   . quinapril (ACCUPRIL) 20 MG tablet Take 1 tablet by mouth daily.  No facility-administered encounter medications on file as of 07/13/2016.      Review of Systems  Constitutional:   No  weight loss, night sweats,  Fevers, chills,  +fatigue, or  lassitude.  HEENT:   No headaches,  Difficulty swallowing,  Tooth/dental problems, or  Sore throat,                No sneezing, itching, ear ache, nasal congestion, post nasal drip,   CV:  No chest pain,  Orthopnea, PND, swelling in lower extremities, anasarca, dizziness, palpitations, syncope.   GI  No heartburn, indigestion, abdominal pain, nausea, vomiting, diarrhea, change in bowel habits, loss of appetite, bloody stools.   Resp:   No chest wall deformity  Skin: no rash or lesions.  GU: no dysuria, change in color of urine, no urgency or frequency.  No flank pain, no hematuria   MS:  No joint pain or swelling.   No decreased range of motion.  No back pain.    Physical Exam  BP 128/76 (BP Location: Left Arm, Cuff Size: Normal)   Pulse 88   Ht _0  (1.6 m)   Wt 159 lb (72.1 kg)   SpO2 93%   BMI 28.17 kg/m   GEN: A/Ox3; pleasant , NAD, elderly on o2    HEENT:  [place]/AT,  EACs-clear, TMs-wnl, NOSE-clear, THROAT-clear, no lesions, no postnasal drip or exudate noted.   NECK:  Supple w/ fair ROM; no JVD; normal carotid impulses w/o bruits; no thyromegaly or nodules palpated; no lymphadenopathy.    RESP  BB crackles , . no accessory muscle use, no dullness to percussion  CARD:  RRR, no m/r/g, no peripheral edema, pulses intact, no cyanosis or clubbing.  GI:   Soft & nt; nml bowel sounds; no organomegaly or masses detected.   Musco: Warm bil, no deformities or joint swelling noted.   Neuro: alert, no focal deficits noted.    Skin: Warm, no lesions or rashes    Lab Results:  CBC    Component Value Date/Time   WBC 7.4 01/17/2014 0334   RBC 4.49 01/17/2014 0334   HGB 13.1 01/17/2014 0334   HCT 39.8 01/17/2014 0334   PLT 220 01/17/2014 0334   MCV 88.6 01/17/2014 0334   MCH 29.2 01/17/2014 0334   MCHC 32.9 01/17/2014 0334   RDW 14.1 01/17/2014 0334    BMET    Component Value Date/Time   NA 138 01/17/2014 0334   K 4.4 01/17/2014 0334   CL 100 01/17/2014 0334   CO2 26 01/17/2014 0334   GLUCOSE 149 (H) 01/17/2014 0334   BUN 10 01/17/2014 0334   CREATININE 0.80 01/17/2014 0334   CREATININE 1.07 01/08/2014 1119   CALCIUM 8.5 01/17/2014 0334   GFRNONAA 65 (L) 01/17/2014 0334   GFRAA 75 (L) 01/17/2014 0334    BNP No results found for: BNP  ProBNP No results found for: PROBNP  Imaging: No results found.   Assessment & Plan:   ILD (interstitial lung disease) Suspect DOE is from ILD , deconditioning . Appears to be slow progression doubt acute decompensation .  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 Reported labs ok  Will check HRCT chest to see if progressive changes .  Unsure if she could complete PFT -will hold for now .   Plan  Patient Instructions  Continue on Oxygen 4l/m .  Check CT chest .  follow up Dr. Lamonte Sakai  In 2-3 months and As needed  .      Hypoxemia c ont on O2 .  Keep sats >90%.      Rexene Edison, NP 07/13/2016

## 2016-07-13 NOTE — Assessment & Plan Note (Signed)
c ont on O2 .  Keep sats >90%.

## 2016-07-13 NOTE — Patient Instructions (Signed)
Continue on Oxygen 4l/m .  Check CT chest .  follow up Dr. Lamonte Sakai  In 2-3 months and As needed  .

## 2016-07-21 ENCOUNTER — Ambulatory Visit (INDEPENDENT_AMBULATORY_CARE_PROVIDER_SITE_OTHER)
Admission: RE | Admit: 2016-07-21 | Discharge: 2016-07-21 | Disposition: A | Discharge status: Home / Self Care | Payer: Medicare Other | Source: Ambulatory Visit | Attending: Adult Health | Admitting: Adult Health

## 2016-07-21 DIAGNOSIS — J849 Interstitial pulmonary disease, unspecified: Secondary | ICD-10-CM | POA: Diagnosis not present

## 2016-07-24 NOTE — Progress Notes (Signed)
Called spoke with patient, advised of CT results / recs as stated by TP.  Pt verbalized her understanding and denied any questions.

## 2016-07-29 ENCOUNTER — Telehealth: Payer: Self-pay | Admitting: Emergency Medicine

## 2016-07-29 NOTE — Telephone Encounter (Signed)
From: Rinaldo Ratel, CMA  Sent: 07/24/2016  3:50 PM  To: Lorane Gell, CMA, Collier Salina, RN  Subject: PFT w/ DLCO at 7/9 appt w/ RB           Patient needs a:   PFT w/ DLCO -only preSpriometry - no post BD   Prior to her 7.9.18 appt with RB at 3pm  ______________________________________  [facility] for pt.

## 2016-08-07 NOTE — Telephone Encounter (Signed)
Spoke with pt and she stated that she does want the pft but she would prefer to have one scheduled close to her appt time due to her being weak and unable to sit in the lobby for hours after waiting for her appt. Will need to recheck pft schedule.

## 2016-08-14 ENCOUNTER — Ambulatory Visit (INDEPENDENT_AMBULATORY_CARE_PROVIDER_SITE_OTHER): Payer: Medicare Other | Admitting: Cardiology

## 2016-08-14 DIAGNOSIS — I10 Essential (primary) hypertension: Secondary | ICD-10-CM

## 2016-08-14 DIAGNOSIS — E785 Hyperlipidemia, unspecified: Secondary | ICD-10-CM

## 2016-08-14 DIAGNOSIS — I2510 Atherosclerotic heart disease of native coronary artery without angina pectoris: Secondary | ICD-10-CM | POA: Diagnosis not present

## 2016-08-14 DIAGNOSIS — R0609 Other forms of dyspnea: Secondary | ICD-10-CM | POA: Diagnosis not present

## 2016-08-14 DIAGNOSIS — Z9861 Coronary angioplasty status: Secondary | ICD-10-CM

## 2016-08-14 NOTE — Patient Instructions (Signed)
NO CHANGE      Your physician wants you to follow-up in [place].You will receive a reminder letter in the mail two months in advance. If you don't receive a letter, please call our office to schedule the follow-up appointment.    If you need a refill on your cardiac medications before your next appointment, please call your pharmacy.

## 2016-08-14 NOTE — Progress Notes (Signed)
PCP: Mayra Neer, MD  Clinic Note: Chief Complaint  Patient presents with  . Follow-up    3 mo rov - follow-up Myoview  . Coronary Artery Disease    HPI: Christine Pratt is a 83 y.o. female with a PMH below who presents today for Three-month follow-up after Myoview to evaluate for dyspnea And intermittent chest pain. In addition to her coronary disease with PCI, she is developed significant pulmonary fibrosis on home oxygen and is quite deconditioned. He is minimally active.  He also has significant DJD leading to her decreased activity as well. She walks with either a walker or cane at baseline.  Christine Pratt was last seen on 05/14/2016 by Ignacia Bayley, NP for initial follow-up after stress test. He noted continued exertional dyspnea with activity and also easy fatigue. No severe chest pain. She was recommended to follow-up with pulmonary medicine and suspected that some of her symptoms are related to deconditioning and pulmonary fibrosis.  Recent Hospitalizations: None  Studies Personally Reviewed - (if available, images/films reviewed: From Epic Chart or Care Everywhere)  Myoview 05/08/2016: LOW RISK. EF 70%. Hyperdynamic LV function. Ischemia or infarction.  Interval History: Christine Pratt presents today really without too much the way of any complaints. She does get tired easily but is trying to get out and about more. She does walk around the house with her walker or cane. She is trying to limit more each day. She maybe walks around once or twice and will get tired and sit down. She is not noticing persistent chest discomfort. Still just having intermittent twinges of chest pain. No real PND, orthopnea or edema. No lightheadedness or dizziness, syncope/near syncope or TIA/amaurosis fugax symptoms.  No melena, hematochezia, hematuria, or epstaxis. No claudication.  ROS: A comprehensive was performed. Review of Systems  Constitutional: Positive for malaise/fatigue (generally low  energy).  Respiratory: Positive for shortness of breath (baseline = on Home O2 4L) and wheezing. Negative for cough.   Cardiovascular: Negative for leg swelling.  Gastrointestinal: Negative.        Indigestion  Musculoskeletal: Positive for back pain (low back to hips). Negative for falls.  Neurological: Positive for dizziness (dizzy washing hair; often "overdoes it" & gets dizzy - has to lie down.  ).  Psychiatric/Behavioral: Positive for memory loss. Negative for hallucinations. The patient is not nervous/anxious and does not have insomnia.    I have reviewed and (if needed) personally updated the patient's problem list, medications, allergies, past medical and surgical history, social and family history.   Past Medical History:  Diagnosis Date  . CAD S/P percutaneous coronary angioplasty 01/16/2014   a. 01/2014 Cath/PCI: LM nl, LAD 50ost, 70-44m(+ FFR--> XIENCE ALPINE DES 2.25 MM X 18 MM --> 2.4 MM, LCX min irregs, OM1 nl, RCA 20p, 50-65mmin irregs distal, RPDA/RPAV nl;  b. 04/2016 Lexiscan MV: EF 73%, no ischemia/infarct.  . CKD (chronic kidney disease), stage III   . Claudication ([facility]   a. 11/2015 ABI's: R 1.25, L1.1  . Depression   . DJD (degenerative joint disease)   . Essential hypertension   . Osteoarthritis   . Pulmonary fibrosis (HCC)    Interstitial lung disease on home oxygen; mild to moderate pulmonary hypertension by RHC (PAP 40/18 mmHg)  . Pulmonary nodule   . Spinal stenosis     Past Surgical History:  Procedure Laterality Date  . APPENDECTOMY    . BACK SURGERY    . LEFT AND RIGHT HEART CATHETERIZATION WITH  CORONARY ANGIOGRAM N/A 01/16/2014   L & R HCATH W/ COR ANGIO; Leonie Man, MD;  [facility] CATH LAB; 70-80% mid LAD --> PCI, 50% mid RCA, PAP 40/18 mmHg, PCWP 12 mmHg  . NM MYOVIEW LTD  04/2016   LOW RISK. EF 70%. Hyperdynamic LV function. Ischemia or infarction.  Marland Kitchen PERCUTANEOUS CORONARY STENT INTERVENTION (PCI-S)  01/16/2014   mLAD - XIENCE ALPINE DES 2.25 MM X  18 MM --> 2.4 MM.  Marland Kitchen TRANSTHORACIC ECHOCARDIOGRAM  12/06/2013    Mild LVH. Vigorous function with EF 65-70%. No regional WMA, high LV filling pressures, MAC with mld MR.  . TUBAL LIGATION      Current Meds  Medication Sig  . albuterol (PROVENTIL) (2.5 MG/3ML) 0.083% nebulizer solution Take 2.5 mg by nebulization every 6 (six) hours as needed.    Medication Sig  albuterol (PROVENTIL) (2.5 MG/3ML) 0.083% nebulizer solution Take 2.5 mg by nebulization every 6 (six) hours as needed.   amLODipine (NORVASC) 5 MG tablet take 1 tablet by mouth once daily  atorvastatin (LIPITOR) 20 MG tablet Take 0.5 tablets (10 mg total) by mouth daily.  escitalopram (LEXAPRO) 20 MG tablet Take 20 mg by mouth daily.   furosemide (LASIX) 20 MG tablet Take 20 mg by mouth daily.   LORazepam (ATIVAN) 1 MG tablet Take 1 mg by mouth daily.   nitroGLYCERIN (NITROSTAT) 0.4 MG SL tablet Place 1 tablet (0.4 mg total) under the tongue every 5 (five) minutes as needed for chest pain.  Oxycodone HCl 20 MG TABS Take 20 mg by mouth 3 (three) times daily.   Polyethylene Glycol 3350 (MIRALAX PO) Take 17 g by mouth daily as needed (for constipation).   quinapril (ACCUPRIL) 20 MG tablet Take 1 tablet by mouth daily.    No Known Allergies  Social History   Social History  . Marital status: Widowed    Spouse name: N/A  . Number of children: N/A  . Years of education: N/A   Occupational History  . Retired    Social History Main Topics  . Smoking status: Never Smoker  . Smokeless tobacco: Never Used  . Alcohol use No  . Drug use: No  . Sexual activity: Not Asked   Other Topics Concern  . None   Social History Narrative   Retired. Widowed.   Never smoked and does not drink alcohol.    family history includes Cancer in her mother; Heart attack in her father; Heart disease in her brother; Heart failure in her sister.  Wt Readings from Last 3 Encounters:  08/14/16 159 lb 12.8 oz (72.5 kg)  07/13/16 159 lb (72.1  kg)  05/14/16 162 lb 12.8 oz (73.8 kg)    PHYSICAL EXAM BP 127/68   Pulse 70   Ht _0  (1.575 m)   Wt 159 lb 12.8 oz (72.5 kg)   BMI 29.23 kg/m  General appearance: alert, cooperative, appears stated age, no distress. Borderline obese. Well groomed. HEENT: [place]/AT, EOMI, MMM, anicteric sclera Neck: no adenopathy, no carotid bruit and no JVD Lungs: On home O2. Nonlabored. Diffuse interstitial sounds/crackles and rhonchi bilaterally and wheezing. No rales noted. Heart: regular rate and rhythm, S1 &S2 normal, no murmur, click, rub or gallop; nondisplaced PMI Abdomen: soft, non-tender; bowel sounds normal; no masses,  no organomegaly; no HJR Extremities: extremities normal, atraumatic, no cyanosis, and edema - trivial Pulses: 2+ and symmetric;  Skin: temperature normal, texture normal and No lesions or wounds Neurologic: Mental status: Alert & oriented x 3,  thought content appropriate; non-focal exam.  Pleasant mood & affect.   Adult ECG Report n/a  Other studies Reviewed: Additional studies/ records that were reviewed today include:  Recent Labs:  n/a  ASSESSMENT / PLAN: Problem List Items Addressed This Visit    CAD S/P percutaneous coronary angioplasty (Chronic)    Not really having active anginal symptoms. She has had now a negative Myoview confirming no ischemia. I think a lot of her symptoms are nonanginal in nature. Trying to simply do medical management. She is on ACE inhibitor but no beta blocker to avoid fatigue and pulmonary issues. She is on a daily amlodipine for blood pressure and antianginal effect. Okay to take additional dose of Lasix beyond her baseline dose of 20 mg      DOE (dyspnea on exertion) (Chronic)    Hyperdynamic left ventricular function. She probably has some diastolic dysfunction, but as long as her blood pressure is controlled, she doesn't appear to be having true diastolic heart failure symptoms. Mostly related to her lung disease.      Essential  hypertension (Chronic)    Well-controlled on current medications. ACE inhibitor plus amlodipine and Lasix.      Hyperlipidemia with target LDL less than 70 (Chronic)    On statin. Has been followed by her PCP. I don't have recent labs.         Current medicines are reviewed at length with the patient today. (+/- concerns) n/a The following changes have been made: n/a  Patient Instructions  NO CHANGE      Your physician wants you to follow-up in [place].You will receive a reminder letter in the mail two months in advance. If you don't receive a letter, please call our office to schedule the follow-up appointment.    If you need a refill on your cardiac medications before your next appointment, please call your pharmacy.    Studies Ordered:   No orders of the defined types were placed in this encounter.     Glenetta Hew, M.D., M.S. Interventional Cardiologist   Pager # [phone] Phone # [phone] [address] [place], [place] [address]

## 2016-08-16 ENCOUNTER — Encounter: Payer: Self-pay | Admitting: Cardiology

## 2016-08-16 NOTE — Assessment & Plan Note (Signed)
Hyperdynamic left ventricular function. She probably has some diastolic dysfunction, but as long as her blood pressure is controlled, she doesn't appear to be having true diastolic heart failure symptoms. Mostly related to her lung disease.

## 2016-08-16 NOTE — Assessment & Plan Note (Signed)
Not really having active anginal symptoms. She has had now a negative Myoview confirming no ischemia. I think a lot of her symptoms are nonanginal in nature. Trying to simply do medical management. She is on ACE inhibitor but no beta blocker to avoid fatigue and pulmonary issues. She is on a daily amlodipine for blood pressure and antianginal effect. Okay to take additional dose of Lasix beyond her baseline dose of 20 mg

## 2016-08-16 NOTE — Assessment & Plan Note (Signed)
Well-controlled on current medications. ACE inhibitor plus amlodipine and Lasix.

## 2016-08-16 NOTE — Assessment & Plan Note (Signed)
On statin. Has been followed by her PCP. I don't have recent labs.

## 2016-08-20 NOTE — Telephone Encounter (Signed)
Called and spoke to pt. PFT appt made on 09/21/2016. Pt verbalized understanding and denied any further questions or concerns at this time.

## 2016-09-18 ENCOUNTER — Other Ambulatory Visit: Payer: Self-pay | Admitting: Emergency Medicine

## 2016-09-18 DIAGNOSIS — J849 Interstitial pulmonary disease, unspecified: Secondary | ICD-10-CM

## 2016-09-21 ENCOUNTER — Encounter: Payer: Self-pay | Admitting: Emergency Medicine

## 2016-09-21 ENCOUNTER — Ambulatory Visit: Payer: Medicare Other | Admitting: Emergency Medicine

## 2016-09-21 ENCOUNTER — Ambulatory Visit (INDEPENDENT_AMBULATORY_CARE_PROVIDER_SITE_OTHER): Payer: Medicare Other | Admitting: Emergency Medicine

## 2016-09-21 DIAGNOSIS — J849 Interstitial pulmonary disease, unspecified: Secondary | ICD-10-CM

## 2016-09-21 NOTE — Progress Notes (Signed)
PFT attempted today.

## 2016-09-21 NOTE — Progress Notes (Signed)
Subjective:    Patient ID: Christine Pratt, female    DOB: 1925/09/05, 83 y.o.   MRN: [id number]  HPI 83 yo never smoker followed by Dr Brigitte Pulse for HTN, OA, CKD. Carries a dx of ILD in a UIP pattern with some mediastinal LAD first noted on a CT scan performed 10/15/08 to eval a RML pleural based nodule. She has associated hypoxemia. We performed autoimmune labs which were all normal. TTE 12/06/13 showed normal LV function and pulmonary arterial pressures. She underwent a normal myocardial perfusion study 05/08/16. Her most recent high-resolution CT scan was 07/21/16 that I personally reviewed. This shows some mild interval progression of her base predominant fibrotic lung disease compared with 2015. Primary function testing was ordered for today but she was unable to complete. She may have benefited in the past from bronchodilators, has been tried before on Advair and Symbicort. Denies any aspiration sx.   She complains today of dyspnea w exertion, happens especially when she is on RA - she tries to walk without the O2. She does a bit better when she wears the o2. She is fatigued, has low energy. She states that she may have been started on trelegy by Dr Brigitte Pulse, she is unclear whether she benefited from it.                                                                                                                                                            Review of Systems  Constitutional: Negative for fever and unexpected weight change.  HENT: Negative for congestion, dental problem, ear pain, nosebleeds, postnasal drip, rhinorrhea, sinus pressure, sneezing, sore throat and trouble swallowing.   Eyes: Negative for redness and itching.  Respiratory: Positive for shortness of breath. Negative for cough, chest tightness and wheezing.   Cardiovascular: Negative for palpitations and leg swelling.  Gastrointestinal: Negative for nausea and vomiting.  Genitourinary: Negative for dysuria.  Musculoskeletal:  Negative for joint swelling.  Skin: Negative for rash.  Neurological: Negative for headaches.  Hematological: Does not bruise/bleed easily.  Psychiatric/Behavioral: Negative for dysphoric mood. The patient is not nervous/anxious.         Objective:   Physical Exam Vitals:   09/21/16 1442 09/21/16 1444  BP:  112/64  Pulse:  62  SpO2:  93%  Weight: 159 lb 12.8 oz (72.5 kg) 159 lb (72.1 kg)  Height: 5' 0.5" (1.537 m) 5' 0.5" (1.537 m)   Gen: Pleasant, elderly woman, in no distress,  normal affect on O2  ENT: No lesions,  mouth clear,  oropharynx clear, no postnasal drip  Neck: No JVD, no TMG, no carotid bruits  Lungs: No use of accessory muscles, Bilateral inspiratory squeaks, crackles at both bases and mid way up both lung fields.  Cardiovascular: RRR, heart sounds normal, no  murmur or gallops, no peripheral edema  Musculoskeletal: No deformities, no cyanosis or clubbing  Neuro: alert, non focal  Skin: Warm, no lesions or rashes     03/05/14 --  COMPARISON: Chest CT 03/29/2012.  FINDINGS: Portions of the examination are severely limited by patient respiratory motion.  Mediastinum: Heart size is borderline enlarged. There is no significant pericardial fluid, thickening or pericardial calcification. There is atherosclerosis of the thoracic aorta, the great vessels of the mediastinum and the coronary arteries, including calcified atherosclerotic plaque in the left main, left anterior descending, left circumflex and right coronary arteries. Severe calcifications of the mitral valve. Numerous borderline enlarged mediastinal and bilateral hilar lymph nodes are noted, but are nonspecific. Esophagus is unremarkable in appearance.  Lungs/Pleura: Again noted is a pattern of peripheral and peribronchovascular reticulation, with associated architectural distortion, septal thickening, lower lung parenchymal banding, cylindrical and varicose traction bronchiectasis, and  a few areas of early honeycombing. These features have a definitive craniocaudal gradient, and are strongly suggestive of usual interstitial pneumonia. Inspiratory and expiratory imaging is severely limited by respiratory motion, but does appear to demonstrates some air trapping, indicative of small airways disease. No confluent consolidative airspace disease. No pleural effusions.  Upper Abdomen: Extensive atherosclerosis.  Musculoskeletal: There are no aggressive appearing lytic or blastic lesions noted in the visualized portions of the skeleton.  IMPRESSION: 1. The appearance of the lungs is again most compatible with usual interstitial pneumonia (UIP), with very little progression of disease noted when compared to the prior examination. 2. Air trapping is noted, indicative of small airways disease. 3. Atherosclerosis, including left main and 3 vessel coronary artery disease. 4. Additional incidental findings, as above.      Assessment & Plan:  ILD (interstitial lung disease) Difficult to tease out her symptoms. Clearly she has progression of her interstitial disease by CT scan of the chest over the last 3-1/2 years. I suspect that she is increased exertional dyspnea due to this. No clear indication for a scheduled bronchodilator. She has possibly benefited from bronchodilators in the past so I would like to continue albuterol as needed. Most important she needs to continue her oxygen reliably.  Baltazar Apo, MD, PhD 09/21/2016, 4:59 PM [facility] [phone] or if no answer [phone]

## 2016-09-21 NOTE — Assessment & Plan Note (Signed)
Difficult to tease out her symptoms. Clearly she has progression of her interstitial disease by CT scan of the chest over the last 3-1/2 years. I suspect that she is increased exertional dyspnea due to this. No clear indication for a scheduled bronchodilator. She has possibly benefited from bronchodilators in the past so I would like to continue albuterol as needed. Most important she needs to continue her oxygen reliably.

## 2016-09-21 NOTE — Patient Instructions (Signed)
Please wear your oxygen with at all times.  Your CT scan of the chest shows slow progression of your lung scarring over the years, last compared with 2015.  Keep albuterol available to use 2 puffs as needed for shortness of breath.  Do not restart Trelegy.  Follow with Dr Lamonte Sakai in 6 months or sooner if you have any problems

## 2017-01-20 ENCOUNTER — Emergency Department (HOSPITAL_COMMUNITY): Payer: Medicare Other

## 2017-01-20 ENCOUNTER — Other Ambulatory Visit: Payer: Self-pay

## 2017-01-20 ENCOUNTER — Inpatient Hospital Stay (HOSPITAL_COMMUNITY)
Admission: EM | Admit: 2017-01-20 | Discharge: 2017-01-25 | DRG: 189 | Disposition: A | Discharge status: Hospice | Payer: Medicare Other | Attending: Nephrology | Admitting: Nephrology

## 2017-01-20 DIAGNOSIS — Z66 Do not resuscitate: Secondary | ICD-10-CM | POA: Diagnosis not present

## 2017-01-20 DIAGNOSIS — J181 Lobar pneumonia, unspecified organism: Secondary | ICD-10-CM

## 2017-01-20 DIAGNOSIS — Z515 Encounter for palliative care: Secondary | ICD-10-CM

## 2017-01-20 DIAGNOSIS — N183 Chronic kidney disease, stage 3 (moderate): Secondary | ICD-10-CM | POA: Diagnosis present

## 2017-01-20 DIAGNOSIS — Z8673 Personal history of transient ischemic attack (TIA), and cerebral infarction without residual deficits: Secondary | ICD-10-CM

## 2017-01-20 DIAGNOSIS — G8929 Other chronic pain: Secondary | ICD-10-CM | POA: Diagnosis present

## 2017-01-20 DIAGNOSIS — D631 Anemia in chronic kidney disease: Secondary | ICD-10-CM | POA: Diagnosis present

## 2017-01-20 DIAGNOSIS — I130 Hypertensive heart and chronic kidney disease with heart failure and stage 1 through stage 4 chronic kidney disease, or unspecified chronic kidney disease: Secondary | ICD-10-CM | POA: Diagnosis present

## 2017-01-20 DIAGNOSIS — Z79891 Long term (current) use of opiate analgesic: Secondary | ICD-10-CM

## 2017-01-20 DIAGNOSIS — S92355A Nondisplaced fracture of fifth metatarsal bone, left foot, initial encounter for closed fracture: Secondary | ICD-10-CM | POA: Diagnosis present

## 2017-01-20 DIAGNOSIS — Z9981 Dependence on supplemental oxygen: Secondary | ICD-10-CM

## 2017-01-20 DIAGNOSIS — J84112 Idiopathic pulmonary fibrosis: Secondary | ICD-10-CM

## 2017-01-20 DIAGNOSIS — Z79899 Other long term (current) drug therapy: Secondary | ICD-10-CM

## 2017-01-20 DIAGNOSIS — I2723 Pulmonary hypertension due to lung diseases and hypoxia: Secondary | ICD-10-CM | POA: Diagnosis present

## 2017-01-20 DIAGNOSIS — W19XXXA Unspecified fall, initial encounter: Secondary | ICD-10-CM | POA: Diagnosis present

## 2017-01-20 DIAGNOSIS — E1122 Type 2 diabetes mellitus with diabetic chronic kidney disease: Secondary | ICD-10-CM | POA: Diagnosis present

## 2017-01-20 DIAGNOSIS — Z9851 Tubal ligation status: Secondary | ICD-10-CM

## 2017-01-20 DIAGNOSIS — S92352A Displaced fracture of fifth metatarsal bone, left foot, initial encounter for closed fracture: Secondary | ICD-10-CM

## 2017-01-20 DIAGNOSIS — F329 Major depressive disorder, single episode, unspecified: Secondary | ICD-10-CM | POA: Diagnosis present

## 2017-01-20 DIAGNOSIS — J9621 Acute and chronic respiratory failure with hypoxia: Principal | ICD-10-CM

## 2017-01-20 DIAGNOSIS — N390 Urinary tract infection, site not specified: Secondary | ICD-10-CM | POA: Diagnosis present

## 2017-01-20 DIAGNOSIS — E1165 Type 2 diabetes mellitus with hyperglycemia: Secondary | ICD-10-CM | POA: Diagnosis present

## 2017-01-20 DIAGNOSIS — J189 Pneumonia, unspecified organism: Secondary | ICD-10-CM | POA: Diagnosis present

## 2017-01-20 DIAGNOSIS — Z955 Presence of coronary angioplasty implant and graft: Secondary | ICD-10-CM

## 2017-01-20 DIAGNOSIS — I50810 Right heart failure, unspecified: Secondary | ICD-10-CM | POA: Diagnosis present

## 2017-01-20 DIAGNOSIS — E1151 Type 2 diabetes mellitus with diabetic peripheral angiopathy without gangrene: Secondary | ICD-10-CM | POA: Diagnosis present

## 2017-01-20 DIAGNOSIS — I2781 Cor pulmonale (chronic): Secondary | ICD-10-CM | POA: Diagnosis present

## 2017-01-20 DIAGNOSIS — I2510 Atherosclerotic heart disease of native coronary artery without angina pectoris: Secondary | ICD-10-CM | POA: Diagnosis present

## 2017-01-20 LAB — URINALYSIS, ROUTINE W REFLEX MICROSCOPIC
Bilirubin Urine: NEGATIVE
Glucose, UA: NEGATIVE mg/dL
Hgb urine dipstick: NEGATIVE
Ketones, ur: NEGATIVE mg/dL
Nitrite: POSITIVE — AB
Protein, ur: NEGATIVE mg/dL
Specific Gravity, Urine: 1.008 (ref 1.005–1.030)
Squamous Epithelial / LPF: NONE SEEN
pH: 6 (ref 5.0–8.0)

## 2017-01-20 LAB — BASIC METABOLIC PANEL
Anion gap: 12 (ref 5–15)
BUN: 24 mg/dL — ABNORMAL HIGH (ref 6–20)
CO2: 26 mmol/L (ref 22–32)
Calcium: 8.6 mg/dL — ABNORMAL LOW (ref 8.9–10.3)
Chloride: 102 mmol/L (ref 101–111)
Creatinine, Ser: 1.34 mg/dL — ABNORMAL HIGH (ref 0.44–1.00)
GFR calc Af Amer: 39 mL/min — ABNORMAL LOW (ref >60)
GFR calc non Af Amer: 34 mL/min — ABNORMAL LOW (ref >60)
Glucose, Bld: 181 mg/dL — ABNORMAL HIGH (ref 65–99)
Potassium: 4 mmol/L (ref 3.5–5.1)
Sodium: 140 mmol/L (ref 135–145)

## 2017-01-20 LAB — CBC
HCT: 38.1 % (ref 36.0–46.0)
Hemoglobin: 12.4 g/dL (ref 12.0–15.0)
MCH: 30.5 pg (ref 26.0–34.0)
MCHC: 32.5 g/dL (ref 30.0–36.0)
MCV: 93.6 fL (ref 78.0–100.0)
Platelets: 257 10*3/uL (ref 150–400)
RBC: 4.07 MIL/uL (ref 3.87–5.11)
RDW: 15.1 % (ref 11.5–15.5)
WBC: 8.9 10*3/uL (ref 4.0–10.5)

## 2017-01-20 LAB — I-STAT TROPONIN, ED: TROPONIN I, POC: 0.06 ng/mL (ref 0.00–0.08)

## 2017-01-20 LAB — CBG MONITORING, ED: Glucose-Capillary: 156 mg/dL — ABNORMAL HIGH (ref 65–99)

## 2017-01-20 LAB — GLUCOSE, CAPILLARY: GLUCOSE-CAPILLARY: 136 mg/dL — AB (ref 65–99)

## 2017-01-20 MED ORDER — SODIUM CHLORIDE 0.9 % IV BOLUS (SEPSIS)
500.0000 mL | Freq: Once | INTRAVENOUS | Status: AC
  Administered 2017-01-20: 500 mL via INTRAVENOUS

## 2017-01-20 MED ORDER — ENOXAPARIN SODIUM 30 MG/0.3ML ~~LOC~~ SOLN
30.0000 mg | SUBCUTANEOUS | Status: DC
  Administered 2017-01-21 – 2017-01-25 (×5): 30 mg via SUBCUTANEOUS
  Filled 2017-01-20 (×5): qty 0.3

## 2017-01-20 MED ORDER — ACETAMINOPHEN 325 MG PO TABS: 650.0000 mg | ORAL_TABLET | Freq: Four times a day (QID) | ORAL | Status: DC | PRN

## 2017-01-20 MED ORDER — AMLODIPINE BESYLATE 5 MG PO TABS
5.0000 mg | ORAL_TABLET | Freq: Every day | ORAL | Status: DC
  Administered 2017-01-21 – 2017-01-25 (×5): 5 mg via ORAL
  Filled 2017-01-20 (×5): qty 1

## 2017-01-20 MED ORDER — LISINOPRIL 20 MG PO TABS
20.0000 mg | ORAL_TABLET | Freq: Every day | ORAL | Status: DC
  Administered 2017-01-21 – 2017-01-25 (×5): 20 mg via ORAL
  Filled 2017-01-20 (×5): qty 1

## 2017-01-20 MED ORDER — LORAZEPAM 1 MG PO TABS
1.0000 mg | ORAL_TABLET | Freq: Every day | ORAL | Status: DC
  Administered 2017-01-21 – 2017-01-25 (×5): 1 mg via ORAL
  Filled 2017-01-20 (×5): qty 1

## 2017-01-20 MED ORDER — ALBUTEROL SULFATE (2.5 MG/3ML) 0.083% IN NEBU: 2.5000 mg | INHALATION_SOLUTION | Freq: Four times a day (QID) | RESPIRATORY_TRACT | Status: DC | PRN

## 2017-01-20 MED ORDER — DEXTROSE 5 % IV SOLN: 500.0000 mg | INTRAVENOUS | Status: DC

## 2017-01-20 MED ORDER — NITROGLYCERIN 0.4 MG SL SUBL: 0.4000 mg | SUBLINGUAL_TABLET | SUBLINGUAL | Status: DC | PRN

## 2017-01-20 MED ORDER — FUROSEMIDE 20 MG PO TABS
20.0000 mg | ORAL_TABLET | Freq: Every day | ORAL | Status: DC
  Administered 2017-01-21 – 2017-01-25 (×4): 20 mg via ORAL
  Filled 2017-01-20 (×5): qty 1

## 2017-01-20 MED ORDER — ESCITALOPRAM OXALATE 20 MG PO TABS
20.0000 mg | ORAL_TABLET | Freq: Every day | ORAL | Status: DC
  Administered 2017-01-21 – 2017-01-25 (×5): 20 mg via ORAL
  Filled 2017-01-20 (×5): qty 1

## 2017-01-20 MED ORDER — ATORVASTATIN CALCIUM 10 MG PO TABS
10.0000 mg | ORAL_TABLET | Freq: Every day | ORAL | Status: DC
  Administered 2017-01-22: 10 mg via ORAL
  Filled 2017-01-20: qty 1

## 2017-01-20 MED ORDER — OXYCODONE HCL 5 MG PO TABS
20.0000 mg | ORAL_TABLET | Freq: Three times a day (TID) | ORAL | Status: DC
  Administered 2017-01-21 – 2017-01-25 (×9): 20 mg via ORAL
  Filled 2017-01-20 (×11): qty 4

## 2017-01-20 MED ORDER — ACETAMINOPHEN 650 MG RE SUPP: 650.0000 mg | Freq: Four times a day (QID) | RECTAL | Status: DC | PRN

## 2017-01-20 MED ORDER — QUINAPRIL HCL 10 MG PO TABS: 20.0000 mg | ORAL_TABLET | Freq: Every day | ORAL | Status: DC

## 2017-01-20 MED ORDER — DEXTROSE 5 % IV SOLN
1.0000 g | INTRAVENOUS | Status: DC
  Administered 2017-01-21 – 2017-01-22 (×2): 1 g via INTRAVENOUS
  Filled 2017-01-20 (×2): qty 10

## 2017-01-20 MED ORDER — IPRATROPIUM-ALBUTEROL 0.5-2.5 (3) MG/3ML IN SOLN
3.0000 mL | Freq: Once | RESPIRATORY_TRACT | Status: AC
  Administered 2017-01-20: 3 mL via RESPIRATORY_TRACT
  Filled 2017-01-20: qty 3

## 2017-01-20 MED ORDER — DEXTROSE 5 % IV SOLN
500.0000 mg | Freq: Once | INTRAVENOUS | Status: AC
  Administered 2017-01-20: 500 mg via INTRAVENOUS
  Filled 2017-01-20: qty 500

## 2017-01-20 MED ORDER — ONDANSETRON HCL 4 MG/2ML IJ SOLN: 4.0000 mg | Freq: Four times a day (QID) | INTRAMUSCULAR | Status: DC | PRN

## 2017-01-20 MED ORDER — INSULIN ASPART 100 UNIT/ML ~~LOC~~ SOLN
0.0000 [IU] | Freq: Three times a day (TID) | SUBCUTANEOUS | Status: DC
  Administered 2017-01-23 – 2017-01-24 (×3): 2 [IU] via SUBCUTANEOUS
  Administered 2017-01-24 – 2017-01-25 (×4): 3 [IU] via SUBCUTANEOUS

## 2017-01-20 MED ORDER — POLYETHYLENE GLYCOL 3350 17 GM/SCOOP PO POWD
0.5000 | Freq: Every day | ORAL | Status: DC | PRN
  Filled 2017-01-20: qty 255

## 2017-01-20 MED ORDER — DEXTROSE 5 % IV SOLN
1.0000 g | Freq: Once | INTRAVENOUS | Status: AC
  Administered 2017-01-20: 1 g via INTRAVENOUS
  Filled 2017-01-20: qty 10

## 2017-01-20 MED ORDER — ONDANSETRON HCL 4 MG PO TABS: 4.0000 mg | ORAL_TABLET | Freq: Four times a day (QID) | ORAL | Status: DC | PRN

## 2017-01-20 NOTE — ED Notes (Addendum)
Noah Delaine stated that that this room is still being cleaned and would be ready in 30-45 mins. Report has been given and patient will go up at 2315

## 2017-01-20 NOTE — ED Triage Notes (Signed)
Per EMS Pt was driving in car today and felt more weak than usual. States she's been weak for 3 weeks. Has been without diabetes meds for 4 days. Assumed weakness was from low CBG and ate a bunch of sugar to bring it up. CBG is now 178. On O2 4L/min at home. Sats in 80's today on arrival so patient placed on nonrebreather to bring up. Sats 99% now.

## 2017-01-20 NOTE — H&P (Addendum)
History and Physical    Christine Pratt PZW:[id number] DOB: 1925-06-20 DOA: 01/20/2017  PCP: Mayra Neer, MD  Patient coming from: Home.  Chief Complaint: Increasing weakness.  HPI: Christine Pratt is a 83 y.o. female with history of pulmonary fibrosis, CAD status post stenting, chronic kidney disease, hypertension was brought to the ER after patient was found to have increasing weakness and fatigue.  Patient usually gets short of breath on minimal exertion.  Denies any chest pain productive cough fever or chills.  Over the last 1 week patient had 3 near syncopal episodes last one was today.  Patient states when she walks her legs give away.  She denies any focal deficits otherwise.  Patient states she was recently diagnosed with diabetes mellitus type 2 and had taken oral hypoglycemics for 1 week and then had stopped taking because of patient feels she is getting the weakness from that medication.  Last 3 days she has not taken the medication.  ED Course: In the ER chest x-ray shows possible pneumonia and UA which is concerning for UTI.  Patient also has sustained a fracture of the left fourth metatarsal for which patient was placed on a brace.  Patient was started on antibiotics and admitted for further observation.  Review of Systems: As per HPI, rest all negative.   Past Medical History:  Diagnosis Date  . CAD S/P percutaneous coronary angioplasty 01/16/2014   a. 01/2014 Cath/PCI: LM nl, LAD 50ost, 70-10m(+ FFR--> XIENCE ALPINE DES 2.25 MM X 18 MM --> 2.4 MM, LCX min irregs, OM1 nl, RCA 20p, 50-657mmin irregs distal, RPDA/RPAV nl;  b. 04/2016 Lexiscan MV: EF 73%, no ischemia/infarct.  . CKD (chronic kidney disease), stage III   . [place]   a. 11/2015 ABI's: R 1.25, L1.1  . Depression   . DJD (degenerative joint disease)   . Essential hypertension   . Osteoarthritis   . Pulmonary fibrosis (HCC)    Interstitial lung disease on home oxygen; mild to moderate pulmonary  hypertension by RHC (PAP 40/18 mmHg)  . Pulmonary nodule   . Spinal stenosis     Past Surgical History:  Procedure Laterality Date  . APPENDECTOMY    . BACK SURGERY    . NM MYOVIEW LTD  04/2016   LOW RISK. EF 70%. Hyperdynamic LV function. Ischemia or infarction.  . Marland KitchenERCUTANEOUS CORONARY STENT INTERVENTION (PCI-S)  01/16/2014   mLAD - XIENCE ALPINE DES 2.25 MM X 18 MM --> 2.4 MM.  . Marland KitchenRANSTHORACIC ECHOCARDIOGRAM  12/06/2013    Mild LVH. Vigorous function with EF 65-70%. No regional WMA, high LV filling pressures, MAC with mld MR.  . TUBAL LIGATION       reports that  has never smoked. she has never used smokeless tobacco. She reports that she does not drink alcohol or use drugs.  No Known Allergies  Family History  Problem Relation Age of Onset  . Heart failure Sister   . Cancer Mother        kidney  . Heart attack Father   . Heart disease Brother     Prior to Admission medications   Medication Sig Start Date End Date Taking? Authorizing Provider  albuterol (PROVENTIL) (2.5 MG/3ML) 0.083% nebulizer solution Take 2.5 mg by nebulization every 6 (six) hours as needed.  07/24/16  Yes [provider]  albuterol (VENTOLIN HFA) 108 (90 Base) MCG/ACT inhaler Inhale 2 puffs daily as needed into the lungs for wheezing or shortness of breath.  Yes [provider]  amLODipine (NORVASC) 5 MG tablet take 1 tablet by mouth once daily 05/04/16  Yes Leonie Man, MD  atorvastatin (LIPITOR) 20 MG tablet Take 0.5 tablets (10 mg total) by mouth daily. 04/30/16 01/20/17 Yes Rogelia Mire, NP  Cyanocobalamin (VITAMIN B 12 PO) Take 1 tablet daily by mouth.   Yes [provider]  escitalopram (LEXAPRO) 20 MG tablet Take 20 mg by mouth daily.    Yes [provider]  furosemide (LASIX) 20 MG tablet Take 20 mg by mouth daily.    Yes [provider]  LORazepam (ATIVAN) 1 MG tablet Take 1 mg by mouth daily.    Yes [provider]  nitroGLYCERIN  (NITROSTAT) 0.4 MG SL tablet Place 1 tablet (0.4 mg total) under the tongue every 5 (five) minutes as needed for chest pain. 02/01/14  Yes Lyda Jester M, PA-C  Oxycodone HCl 20 MG TABS Take 20 mg by mouth 3 (three) times daily.    Yes [provider]  Polyethylene Glycol 3350 (MIRALAX PO) Take 17 g by mouth daily as needed (for constipation).    Yes [provider]  quinapril (ACCUPRIL) 20 MG tablet Take 1 tablet by mouth daily.   Yes [provider]    Physical Exam: Vitals:   01/20/17 1700 01/20/17 1702 01/20/17 1800 01/20/17 1915  BP: (!) 170/91 (!) 170/91 (!) 159/86 (!) 188/76  Pulse: 76 80 80 92  Resp: _0 (!) 22  Temp:    98.1 F (36.7 C)  TempSrc:    Oral  SpO2: 98% 97% 94% 94%      Constitutional: Moderately built and nourished. Vitals:   01/20/17 1700 01/20/17 1702 01/20/17 1800 01/20/17 1915  BP: (!) 170/91 (!) 170/91 (!) 159/86 (!) 188/76  Pulse: 76 80 80 92  Resp: _1 (!) 22  Temp:    98.1 F (36.7 C)  TempSrc:    Oral  SpO2: 98% 97% 94% 94%   Eyes: Anicteric no pallor. ENMT: No discharge from the ears eyes nose or mouth. Neck: No mass felt.  No neck rigidity. Respiratory: No rhonchi or crepitations. Cardiovascular: S1-S2 heard no murmurs appreciated. Abdomen: Soft nontender bowel sounds present Musculoskeletal: Left foot brace. Skin: No rash. Neurologic: Alert awake oriented to time place and person.  Moves all extremities. Psychiatric: Appears normal.  Normal affect.   Labs on Admission: I have personally reviewed following labs and imaging studies  CBC: Recent Labs  Lab 01/20/17 1502  WBC 8.9  HGB 12.4  HCT 38.1  MCV 93.6  PLT 728   Basic Metabolic Panel: Recent Labs  Lab 01/20/17 1502  NA 140  K 4.0  CL 102  CO2 26  GLUCOSE 181*  BUN 24*  CREATININE 1.34*  CALCIUM 8.6*   GFR: CrCl cannot be calculated (Unknown ideal weight.). Liver Function Tests: No results for input(s): AST, ALT,  ALKPHOS, BILITOT, PROT, ALBUMIN in the last 168 hours. No results for input(s): LIPASE, AMYLASE in the last 168 hours. No results for input(s): AMMONIA in the last 168 hours. Coagulation Profile: No results for input(s): INR, PROTIME in the last 168 hours. Cardiac Enzymes: No results for input(s): CKTOTAL, CKMB, CKMBINDEX, TROPONINI in the last 168 hours. BNP (last 3 results) No results for input(s): PROBNP in the last 8760 hours. HbA1C: No results for input(s): HGBA1C in the last 72 hours. CBG: Recent Labs  Lab 01/20/17 1515  GLUCAP 156*   Lipid Profile: No results  for input(s): CHOL, HDL, LDLCALC, TRIG, CHOLHDL, LDLDIRECT in the last 72 hours. Thyroid Function Tests: No results for input(s): TSH, T4TOTAL, FREET4, T3FREE, THYROIDAB in the last 72 hours. Anemia Panel: No results for input(s): VITAMINB12, FOLATE, FERRITIN, TIBC, IRON, RETICCTPCT in the last 72 hours. Urine analysis:    Component Value Date/Time   COLORURINE YELLOW 01/20/2017 1723   APPEARANCEUR HAZY (A) 01/20/2017 1723   LABSPEC 1.008 01/20/2017 1723   PHURINE 6.0 01/20/2017 1723   GLUCOSEU NEGATIVE 01/20/2017 1723   HGBUR NEGATIVE 01/20/2017 1723   BILIRUBINUR NEGATIVE 01/20/2017 1723   KETONESUR NEGATIVE 01/20/2017 1723   PROTEINUR NEGATIVE 01/20/2017 1723   NITRITE POSITIVE (A) 01/20/2017 1723   LEUKOCYTESUR TRACE (A) 01/20/2017 1723   Sepsis Labs: _0 (procalcitonin:4,lacticidven:4) )No results found for this or any previous visit (from the past 240 hour(s)).   Radiological Exams on Admission: Dg Chest 2 View  Result Date: 01/20/2017 CLINICAL DATA:  Chest pain and dyspnea x3 days. EXAM: CHEST  2 VIEW COMPARISON:  07/21/2016 chest CT, CXR 05/17/2013 FINDINGS: The cardiopericardial silhouette is top-normal in size with mild-to-moderate atherosclerosis of the aortic arch. No aneurysm is noted. Diffuse interstitial prominence of lung markings with basilar predominance is again noted consistent with  changes of interstitial fibrosis. Superimposed areas of atelectasis and mild central vascular congestion are identified. Subtle subpleural airspace opacity in the anterior right middle lobe is suggested some which is likely related to atelectasis and/or fibrosis. A mild superimposed pneumonia is not excluded. No pneumothorax is seen. Chronic degenerative change of the thoracic spine. IMPRESSION: Aortic atherosclerosis with chronic prominence of interstitial lung markings consistent with fibrosis. Atelectasis and superimposed subtle pneumonia in the right middle lobe distribution is suggested on today's exam. Electronically Signed   By: Ashley Royalty M.D.   On: 01/20/2017 18:29   Ct Head Wo Contrast  Result Date: 01/20/2017 CLINICAL DATA:  Altered level of consciousness EXAM: CT HEAD WITHOUT CONTRAST TECHNIQUE: Contiguous axial images were obtained from the base of the skull through the vertex without intravenous contrast. COMPARISON:  None. FINDINGS: Brain: No large territorial infarction or mass is visualized. No definitive acute hemorrhage is seen. Focal hyperdensity within the right cerebellum measuring 4 mm. No significant mass effect or surrounding edema. Mild basal ganglial calcification on the right. Subtle asymmetric hyperdensity of the right caudate. Old encephalomalacia involving the right temporal and occipital lobes. Moderate atrophy. Moderate small vessel ischemic changes of the white matter. Prominent ventricles likely due to atrophy. Vascular: No hyperdense vessels.  Carotid artery calcification. Skull: No fracture or suspicious bone lesion. Sinuses/Orbits: No acute finding. Other: None IMPRESSION: 1. No definite CT evidence for acute intracranial abnormality. 2. 4 mm hyperdense focus in the right cerebellum, doubtful for hemorrhage, suspect that this may represent a small calcified mass such as cavernoma or focus of old hemosiderin product. MRI could be obtained for further evaluation. 3.  Asymmetric hyperdensity of the right greater than left caudate nuclei, could reflect unusual appearance of idiopathic basal ganglial calcification, but given history, can also be seen in non ketotic hyperglycemia; MRI could also be obtained to further evaluate. 4. 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 Atrophy and moderate small vessel ischemic changes of the white matter. Electronically Signed   By: Donavan Foil M.D.   On: 01/20/2017 18:17   Dg Foot Complete Left  Result Date: 01/20/2017 CLINICAL DATA:  Weakness, pain and swelling base of fifth metatarsal EXAM: LEFT FOOT - COMPLETE 3+ VIEW COMPARISON:  None. FINDINGS: Irregular lucency at  the base of fifth metatarsal consistent with nondisplaced fracture. No definitive articular extension on the views submitted. Moderate bunion formation and degenerative changes at the first MTP joint. Moderate plantar calcaneal spur. Vascular calcifications IMPRESSION: 1. Nondisplaced fracture at the base of the fifth metatarsal 2. Degenerative changes at the first MTP joint with moderate bunion formation at the head of the first metatarsal Electronically Signed   By: Donavan Foil M.D.   On: 01/20/2017 18:26    EKG: Independently reviewed.  Sinus tachycardia.  Assessment/Plan Active Problems:   Pneumonia    1. Fatigue and weakness with exertional shortness of breath and near syncopal episodes-cause not clear but likely probably worsening of pulmonary fibrosis which could have been decompensated by the pneumonia and UTI.  Will check 2D echo.  Cycle cardiac markers.  Patient is on antibiotics for pneumonia and UTI.  Follow cultures.  We will hold Lasix for now. 2. CAD status post stenting -has had unremarkable cardiac stress test this February.  Check cardiac markers.  Patient is on statins. 3. Hypertension on Accupril and amlodipine. 4. Chronic kidney disease stage III -creatinine appears to be at baseline. 5. Recently diagnosed diabetes mellitus -will  place patient on sliding scale coverage. 6. Chronic anemia -follow CBC.  Check anemia panel. 7. Left foot fifth metatarsal fracture -on brace.   DVT prophylaxis: Lovenox. Code Status: Full code. Family Communication: No family at the bedside. Disposition Plan: Home. Consults called: Physical therapy. Admission status: Observation.   Rise Patience MD [facility] Pager [phone].  If 7PM-7AM, please contact night-coverage [email or web address] Password [email or web address]  01/20/2017, 9:47 PM

## 2017-01-20 NOTE — ED Notes (Signed)
Bed: WA08 Expected date:  Expected time:  Means of arrival:  Comments: EMS/weakness

## 2017-01-20 NOTE — ED Provider Notes (Signed)
[facility] DEPT Provider Note   CSN: [id number] Arrival date & time: 01/20/17  1414     History   Chief Complaint Chief Complaint  Patient presents with  . Weakness    HPI Christine Pratt is a 83 y.o. female with a history of CAD s/p percutaneous coronary angioplasty (2015), CKD, HTN, pulmonary Fibrosis who presents to the ED today (with family) for weakness. Patient daughter, and two grandchildren are present who assists with history. Per family patient recently started on unknown oral DM medication after new diagnosis 2-3 weeks ago. After being started on the medication the patient has had intermittent bouts of weakness and confusion. They state that prior to 2-3 weeks ago the patient was ambulatory without assistance. They note she does have sob with ambulation at baseline that limits the distance she is able to travel. She is on 4L/O2 at home at baseline. Over the last few weeks she has had bouts of weakness with ambulation and now requires cane to walk. She also has been getting more short of breath with walking and short of breath at rest. In the last 2 weeks the patient has had 3 falls. She did have episode of LOC today after witnessed fall. Family says patient was unresponsive after with eyes rolled in the back of her head. She was very clammy and daughter tried sternal rubbing her without much response. They gave the patient sugar under her tongue which "perked her up". After EMS arrived they gave the patient oxygen which made the patient feel back to baseline. She is symptom free currently. The patient stopped her new DM medication 4 days ago. No fever, cough, hemoptysis, HA, focal weakness, CP, palpitations, melena, changes in hearing, unilateral neck pain, unilateral weakness, facial asymmetry, N/V, alcohol/drug use, or trauma. Patient lives at home with daughter.  HPI  Past Medical History:  Diagnosis Date  . CAD S/P percutaneous coronary angioplasty  01/16/2014   a. 01/2014 Cath/PCI: LM nl, LAD 50ost, 70-65m(+ FFR--> XIENCE ALPINE DES 2.25 MM X 18 MM --> 2.4 MM, LCX min irregs, OM1 nl, RCA 20p, 50-620mmin irregs distal, RPDA/RPAV nl;  b. 04/2016 Lexiscan MV: EF 73%, no ischemia/infarct.  . CKD (chronic kidney disease), stage III   . Claudication ([facility]   a. 11/2015 ABI's: R 1.25, L1.1  . Depression   . DJD (degenerative joint disease)   . Essential hypertension   . Osteoarthritis   . Pulmonary fibrosis (HCC)    Interstitial lung disease on home oxygen; mild to moderate pulmonary hypertension by RHC (PAP 40/18 mmHg)  . Pulmonary nodule   . Spinal stenosis     Patient Active Problem List   Diagnosis Date Noted  . Leg pain, bilateral 12/02/2015  . Fatigue 11/17/2014  . Hyperlipidemia with target LDL less than 70 03/10/2014  . CAD S/P percutaneous coronary angioplasty   . Obesity (BMI 30-39.9) 01/07/2014  . Essential hypertension 01/07/2014  . DOE (dyspnea on exertion) 01/07/2014  . ILD (interstitial lung disease) ([facility]/16/2015  . Hypoxemia 11/29/2013    Past Surgical History:  Procedure Laterality Date  . APPENDECTOMY    . BACK SURGERY    . NM MYOVIEW LTD  04/2016   LOW RISK. EF 70%. Hyperdynamic LV function. Ischemia or infarction.  . Marland KitchenERCUTANEOUS CORONARY STENT INTERVENTION (PCI-S)  01/16/2014   mLAD - XIENCE ALPINE DES 2.25 MM X 18 MM --> 2.4 MM.  . Marland KitchenRANSTHORACIC ECHOCARDIOGRAM  12/06/2013    Mild LVH. Vigorous  function with EF 65-70%. No regional WMA, high LV filling pressures, MAC with mld MR.  . TUBAL LIGATION      OB History    No data available       Home Medications    Prior to Admission medications   Medication Sig Start Date End Date Taking? Authorizing Provider  albuterol (PROVENTIL) (2.5 MG/3ML) 0.083% nebulizer solution Take 2.5 mg by nebulization every 6 (six) hours as needed.  07/24/16  Yes [provider]  albuterol (VENTOLIN HFA) 108 (90 Base) MCG/ACT inhaler Inhale 2 puffs daily as  needed into the lungs for wheezing or shortness of breath.   Yes [provider]  amLODipine (NORVASC) 5 MG tablet take 1 tablet by mouth once daily 05/04/16  Yes Leonie Man, MD  atorvastatin (LIPITOR) 20 MG tablet Take 0.5 tablets (10 mg total) by mouth daily. 04/30/16 01/20/17 Yes Rogelia Mire, NP  Cyanocobalamin (VITAMIN B 12 PO) Take 1 tablet daily by mouth.   Yes [provider]  escitalopram (LEXAPRO) 20 MG tablet Take 20 mg by mouth daily.    Yes [provider]  furosemide (LASIX) 20 MG tablet Take 20 mg by mouth daily.    Yes [provider]  LORazepam (ATIVAN) 1 MG tablet Take 1 mg by mouth daily.    Yes [provider]  nitroGLYCERIN (NITROSTAT) 0.4 MG SL tablet Place 1 tablet (0.4 mg total) under the tongue every 5 (five) minutes as needed for chest pain. 02/01/14  Yes Lyda Jester M, PA-C  Oxycodone HCl 20 MG TABS Take 20 mg by mouth 3 (three) times daily.    Yes [provider]  Polyethylene Glycol 3350 (MIRALAX PO) Take 17 g by mouth daily as needed (for constipation).    Yes [provider]  quinapril (ACCUPRIL) 20 MG tablet Take 1 tablet by mouth daily.   Yes [provider]    Family History Family History  Problem Relation Age of Onset  . Heart failure Sister   . Cancer Mother        kidney  . Heart attack Father   . Heart disease Brother     Social History Social History   Tobacco Use  . Smoking status: Never Smoker  . Smokeless tobacco: Never Used  Substance Use Topics  . Alcohol use: No  . Drug use: No     Allergies   Patient has no known allergies.   Review of Systems Review of Systems  All other systems reviewed and are negative.    Physical Exam Updated Vital Signs BP (!) 141/71 (BP Location: Left Arm)   Pulse 78   Temp 98.4 F (36.9 C) (Oral)   Resp 20   SpO2 (!) 80% Comment: RA  Physical Exam  Constitutional: She appears well-developed and  well-nourished.  On nasal cannula. NAD.   HENT:  Head: Normocephalic and atraumatic.  Right Ear: External ear normal.  Left Ear: External ear normal.  Nose: Nose normal.  Mouth/Throat: Uvula is midline and oropharynx is clear and moist. Mucous membranes are dry. No tonsillar exudate.  Eyes: Pupils are equal, round, and reactive to light. Right eye exhibits no discharge. Left eye exhibits no discharge. No scleral icterus.  Neck: Trachea normal, normal range of motion and full passive range of motion without pain. Neck supple. No JVD present. No spinous process tenderness present. Carotid bruit is not present. No neck rigidity. Normal range of motion present.  Cardiovascular: Normal rate, regular rhythm and  intact distal pulses.  No murmur heard. Pulses:      Radial pulses are 2+ on the right side, and 2+ on the left side.       Dorsalis pedis pulses are 2+ on the right side, and 2+ on the left side.       Posterior tibial pulses are 2+ on the right side, and 2+ on the left side.  No lower extremity swelling or edema. Calves symmetric in size bilaterally.  Pulmonary/Chest: Effort normal. No stridor. No respiratory distress. She has wheezes ( throughout). She has rhonchi (bases b/l). She exhibits no tenderness.  Abdominal: Soft. Bowel sounds are normal. There is no tenderness. There is no rigidity, no rebound, no guarding and no CVA tenderness.  Musculoskeletal: She exhibits no edema.       Left foot: There is bony tenderness.       Feet:  There is ecchymosis and swelling of the left foot at the base of the 5th MT  Lymphadenopathy:    She has no cervical adenopathy.  Neurological: She is alert.  Mental Status:  Alert, oriented, thought content appropriate, able to give a coherent history. Speech fluent without evidence of aphasia. Able to follow 2 step commands without difficulty.  Cranial Nerves:  II:  Peripheral visual fields grossly normal, pupils equal, round, reactive to  light III,IV, VI: ptosis not present, extra-ocular motions intact bilaterally  V,VII: smile symmetric, eyebrows raise symmetric, facial light touch sensation equal VIII: hearing grossly normal to voice  X: uvula elevates symmetrically  XI: bilateral shoulder shrug symmetric and strong XII: midline tongue extension without fassiculations Motor:  Normal tone. 5/5 in upper and lower extremities bilaterally including strong and equal grip strength and dorsiflexion/plantar flexion Sensory: Sensation intact to light touch in all extremities. Negative Romberg.  Cerebellar: normal finger-to-nose with bilateral upper extremities. Normal heel-to -shin balance bilaterally of the lower extremity. No pronator drift.  Gait: Patient able to walk 4-5 steps to and from bed without assistance  CV: distal pulses palpable throughout   Skin: Skin is warm and dry. Capillary refill takes less than 2 seconds. No rash noted. She is not diaphoretic.  Psychiatric: She has a normal mood and affect.  Nursing note and vitals reviewed.    ED Treatments / Results  Labs (all labs ordered are listed, but only abnormal results are displayed) Labs Reviewed  BASIC METABOLIC PANEL - Abnormal; Notable for the following components:      Result Value   Glucose, Bld 181 (*)    BUN 24 (*)    Creatinine, Ser 1.34 (*)    Calcium 8.6 (*)    GFR calc non Af Amer 34 (*)    GFR calc Af Amer 39 (*)    All other components within normal limits  URINALYSIS, ROUTINE W REFLEX MICROSCOPIC - Abnormal; Notable for the following components:   APPearance HAZY (*)    Nitrite POSITIVE (*)    Leukocytes, UA TRACE (*)    Bacteria, UA MANY (*)    All other components within normal limits  CBG MONITORING, ED - Abnormal; Notable for the following components:   Glucose-Capillary 156 (*)    All other components within normal limits  CBC  I-STAT TROPONIN, ED    EKG  EKG Interpretation  Date/Time:  Wednesday January 20 2017 14:29:14  EST Ventricular Rate:  83 PR Interval:    QRS Duration: 94 QT Interval:  391 QTC Calculation: 460 R Axis:   117 Text Interpretation:  Sinus tachycardia Multiple premature complexes, vent & supraven Prolonged PR interval Right axis deviation Low voltage, precordial leads Confirmed by Virgel Manifold [phone]) on 01/20/2017 4:33:38 PM       Radiology Dg Chest 2 View  Result Date: 01/20/2017 CLINICAL DATA:  Chest pain and dyspnea x3 days. EXAM: CHEST  2 VIEW COMPARISON:  07/21/2016 chest CT, CXR 05/17/2013 FINDINGS: The cardiopericardial silhouette is top-normal in size with mild-to-moderate atherosclerosis of the aortic arch. No aneurysm is noted. Diffuse interstitial prominence of lung markings with basilar predominance is again noted consistent with changes of interstitial fibrosis. Superimposed areas of atelectasis and mild central vascular congestion are identified. Subtle subpleural airspace opacity in the anterior right middle lobe is suggested some which is likely related to atelectasis and/or fibrosis. A mild superimposed pneumonia is not excluded. No pneumothorax is seen. Chronic degenerative change of the thoracic spine. IMPRESSION: Aortic atherosclerosis with chronic prominence of interstitial lung markings consistent with fibrosis. Atelectasis and superimposed subtle pneumonia in the right middle lobe distribution is suggested on today's exam. Electronically Signed   By: Ashley Royalty M.D.   On: 01/20/2017 18:29   Ct Head Wo Contrast  Result Date: 01/20/2017 CLINICAL DATA:  Altered level of consciousness EXAM: CT HEAD WITHOUT CONTRAST TECHNIQUE: Contiguous axial images were obtained from the base of the skull through the vertex without intravenous contrast. COMPARISON:  None. FINDINGS: Brain: No large territorial infarction or mass is visualized. No definitive acute hemorrhage is seen. Focal hyperdensity within the right cerebellum measuring 4 mm. No significant mass effect or surrounding  edema. Mild basal ganglial calcification on the right. Subtle asymmetric hyperdensity of the right caudate. Old encephalomalacia involving the right temporal and occipital lobes. Moderate atrophy. Moderate small vessel ischemic changes of the white matter. Prominent ventricles likely due to atrophy. Vascular: No hyperdense vessels.  Carotid artery calcification. Skull: No fracture or suspicious bone lesion. Sinuses/Orbits: No acute finding. Other: None IMPRESSION: 1. No definite CT evidence for acute intracranial abnormality. 2. 4 mm hyperdense focus in the right cerebellum, doubtful for hemorrhage, suspect that this may represent a small calcified mass such as cavernoma or focus of old hemosiderin product. MRI could be obtained for further evaluation. 3. Asymmetric hyperdensity of the right greater than left caudate nuclei, could reflect unusual appearance of idiopathic basal ganglial calcification, but given history, can also be seen in non ketotic hyperglycemia; MRI could also be obtained to further evaluate. 4. 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 Atrophy and moderate small vessel ischemic changes of the white matter. Electronically Signed   By: Donavan Foil M.D.   On: 01/20/2017 18:17   Dg Foot Complete Left  Result Date: 01/20/2017 CLINICAL DATA:  Weakness, pain and swelling base of fifth metatarsal EXAM: LEFT FOOT - COMPLETE 3+ VIEW COMPARISON:  None. FINDINGS: Irregular lucency at the base of fifth metatarsal consistent with nondisplaced fracture. No definitive articular extension on the views submitted. Moderate bunion formation and degenerative changes at the first MTP joint. Moderate plantar calcaneal spur. Vascular calcifications IMPRESSION: 1. Nondisplaced fracture at the base of the fifth metatarsal 2. Degenerative changes at the first MTP joint with moderate bunion formation at the head of the first metatarsal Electronically Signed   By: Donavan Foil M.D.   On: 01/20/2017 18:26     Procedures Procedures (including critical care time)  Medications Ordered in ED Medications  albuterol (PROVENTIL) (2.5 MG/3ML) 0.083% nebulizer solution 2.5 mg (not administered)  amLODipine (NORVASC) tablet 5 mg (not administered)  atorvastatin (  LIPITOR) tablet 10 mg (not administered)  escitalopram (LEXAPRO) tablet 20 mg (not administered)  furosemide (LASIX) tablet 20 mg (not administered)  LORazepam (ATIVAN) tablet 1 mg (not administered)  nitroGLYCERIN (NITROSTAT) SL tablet 0.4 mg (not administered)  oxyCODONE (Oxy IR/ROXICODONE) immediate release tablet 20 mg (not administered)  polyethylene glycol powder (GLYCOLAX/MIRALAX) container 127.5 g (not administered)  acetaminophen (TYLENOL) tablet 650 mg (not administered)    Or  acetaminophen (TYLENOL) suppository 650 mg (not administered)  ondansetron (ZOFRAN) tablet 4 mg (not administered)    Or  ondansetron (ZOFRAN) injection 4 mg (not administered)  cefTRIAXone (ROCEPHIN) 1 g in dextrose 5 % 50 mL IVPB (not administered)  azithromycin (ZITHROMAX) 500 mg in dextrose 5 % 250 mL IVPB (not administered)  insulin aspart (novoLOG) injection 0-9 Units (not administered)  enoxaparin (LOVENOX) injection 30 mg (not administered)  lisinopril (PRINIVIL,ZESTRIL) tablet 20 mg (not administered)  ipratropium-albuterol (DUONEB) 0.5-2.5 (3) MG/3ML nebulizer solution 3 mL (3 mLs Nebulization Given 01/20/17 1641)  sodium chloride 0.9 % bolus 500 mL (0 mLs Intravenous Stopped 01/20/17 1932)  cefTRIAXone (ROCEPHIN) 1 g in dextrose 5 % 50 mL IVPB (0 g Intravenous Stopped 01/20/17 1932)  azithromycin (ZITHROMAX) 500 mg in dextrose 5 % 250 mL IVPB (0 mg Intravenous Stopped 01/20/17 2106)     Initial Impression / Assessment and Plan / ED Course  I have reviewed the triage vital signs and the nursing notes.  Pertinent labs & imaging results that were available during my care of the patient were reviewed by me and considered in my medical decision  making (see chart for details).     83 year old female with 2-week history of weakness and confusion. She was recently started on new diabetic medication that she has since stopped.  Patient has been having increased shortness of breath at rest and with ambulation.  She has had 3 witnessed falls in the last 2 weeks including one today with loss of consciousness.  Patient initially presented with some hypoxia but this improved after being placed on nonrebreather.  She is without fever, tachypnea, tachycardia or hypotension.  She is non-septic appearing.   On exam the patient does appear mildly dehydrated.  Her neurologic exam is reassuring and without any focal deficits.  There is diffuse wheezing with bibasilar rhonchi.  The patient does have noted injury to left foot at the base of the fifth metatarsal.  I would obtain basic blood work, EKG, troponin, chest x-ray, CT scan of head, urinalysis and x-ray of left foot to evaluate patient's above symptoms and exam findings. Patient had a Lexisacn on 04/2016 that showed EF 78%. Will give 566m of IVF.    CBC without leukocytosis.  Hemoglobin at baseline.  BMP does show elevated creatinine and BUN.  There is also elevated glucose but without signs of DKA as there is no anion gap acidosis.  Electrolytes within normal limits.  Troponin within normal limits. ECG with sinus tachycardia and multiple PVCs but no signs of ischemic event.  Patient's UA shows evidence of UTI with positive nitrates, leukocytes and many bacteria.  Will give IV Rocephin for this.  Chest x-ray shows atelectasis and superimposed subtle pneumonia in the right middle lobe.  Will give patient Azithromycin on top of IV Rocephin for UTi for coverage of CAP. Xray of the foot shows a nondisplaced fracture of the base of the fifth metatarsal.  Will place the patient in a Cam walker for this. CT head findings as above and without any definitive CT evidence  for acute intracranial injury.   Feel the  patient needs inpatient admission for UTI & PNA that is likely causing weakness and confusion.  Will call hospitalist for admission.  Dr. Hal Hope has agreed to admit the patient. Family made aware and are in agreement.   Patient case seen and discussed with Dr. Wilson Singer who is in agreement with plan.   Final Clinical Impressions(s) / ED Diagnoses   Final diagnoses:  Complicated urinary tract infection  Community acquired pneumonia of right middle lobe of lung ([place])  Closed fracture of base of fifth metatarsal bone of left foot, initial encounter    ED Discharge Orders    None       Lorelle Gibbs 01/21/17 0019    Virgel Manifold, MD 01/26/17 1232

## 2017-01-21 ENCOUNTER — Encounter (HOSPITAL_COMMUNITY): Payer: Self-pay

## 2017-01-21 ENCOUNTER — Observation Stay (HOSPITAL_BASED_OUTPATIENT_CLINIC_OR_DEPARTMENT_OTHER): Payer: Medicare Other

## 2017-01-21 DIAGNOSIS — E1151 Type 2 diabetes mellitus with diabetic peripheral angiopathy without gangrene: Secondary | ICD-10-CM | POA: Diagnosis present

## 2017-01-21 DIAGNOSIS — Z66 Do not resuscitate: Secondary | ICD-10-CM | POA: Diagnosis not present

## 2017-01-21 DIAGNOSIS — E1122 Type 2 diabetes mellitus with diabetic chronic kidney disease: Secondary | ICD-10-CM | POA: Diagnosis present

## 2017-01-21 DIAGNOSIS — F329 Major depressive disorder, single episode, unspecified: Secondary | ICD-10-CM | POA: Diagnosis present

## 2017-01-21 DIAGNOSIS — Z8673 Personal history of transient ischemic attack (TIA), and cerebral infarction without residual deficits: Secondary | ICD-10-CM | POA: Diagnosis not present

## 2017-01-21 DIAGNOSIS — Z515 Encounter for palliative care: Secondary | ICD-10-CM | POA: Diagnosis not present

## 2017-01-21 DIAGNOSIS — Z79891 Long term (current) use of opiate analgesic: Secondary | ICD-10-CM | POA: Diagnosis not present

## 2017-01-21 DIAGNOSIS — I50810 Right heart failure, unspecified: Secondary | ICD-10-CM | POA: Diagnosis present

## 2017-01-21 DIAGNOSIS — N390 Urinary tract infection, site not specified: Secondary | ICD-10-CM | POA: Diagnosis present

## 2017-01-21 DIAGNOSIS — I130 Hypertensive heart and chronic kidney disease with heart failure and stage 1 through stage 4 chronic kidney disease, or unspecified chronic kidney disease: Secondary | ICD-10-CM | POA: Diagnosis present

## 2017-01-21 DIAGNOSIS — S92355A Nondisplaced fracture of fifth metatarsal bone, left foot, initial encounter for closed fracture: Secondary | ICD-10-CM | POA: Diagnosis present

## 2017-01-21 DIAGNOSIS — I2723 Pulmonary hypertension due to lung diseases and hypoxia: Secondary | ICD-10-CM | POA: Diagnosis present

## 2017-01-21 DIAGNOSIS — I2510 Atherosclerotic heart disease of native coronary artery without angina pectoris: Secondary | ICD-10-CM | POA: Diagnosis present

## 2017-01-21 DIAGNOSIS — J181 Lobar pneumonia, unspecified organism: Secondary | ICD-10-CM | POA: Diagnosis not present

## 2017-01-21 DIAGNOSIS — R0600 Dyspnea, unspecified: Secondary | ICD-10-CM

## 2017-01-21 DIAGNOSIS — S92352A Displaced fracture of fifth metatarsal bone, left foot, initial encounter for closed fracture: Secondary | ICD-10-CM

## 2017-01-21 DIAGNOSIS — Z955 Presence of coronary angioplasty implant and graft: Secondary | ICD-10-CM | POA: Diagnosis not present

## 2017-01-21 DIAGNOSIS — Z9981 Dependence on supplemental oxygen: Secondary | ICD-10-CM | POA: Diagnosis not present

## 2017-01-21 DIAGNOSIS — I2781 Cor pulmonale (chronic): Secondary | ICD-10-CM | POA: Diagnosis present

## 2017-01-21 DIAGNOSIS — Z79899 Other long term (current) drug therapy: Secondary | ICD-10-CM | POA: Diagnosis not present

## 2017-01-21 DIAGNOSIS — I272 Pulmonary hypertension, unspecified: Secondary | ICD-10-CM | POA: Diagnosis not present

## 2017-01-21 DIAGNOSIS — J189 Pneumonia, unspecified organism: Secondary | ICD-10-CM | POA: Diagnosis present

## 2017-01-21 DIAGNOSIS — D631 Anemia in chronic kidney disease: Secondary | ICD-10-CM | POA: Diagnosis present

## 2017-01-21 DIAGNOSIS — J9621 Acute and chronic respiratory failure with hypoxia: Secondary | ICD-10-CM | POA: Diagnosis present

## 2017-01-21 DIAGNOSIS — G8929 Other chronic pain: Secondary | ICD-10-CM | POA: Diagnosis present

## 2017-01-21 DIAGNOSIS — W19XXXA Unspecified fall, initial encounter: Secondary | ICD-10-CM | POA: Diagnosis present

## 2017-01-21 DIAGNOSIS — J84112 Idiopathic pulmonary fibrosis: Secondary | ICD-10-CM | POA: Diagnosis present

## 2017-01-21 DIAGNOSIS — E1165 Type 2 diabetes mellitus with hyperglycemia: Secondary | ICD-10-CM | POA: Diagnosis present

## 2017-01-21 DIAGNOSIS — N183 Chronic kidney disease, stage 3 (moderate): Secondary | ICD-10-CM | POA: Diagnosis present

## 2017-01-21 LAB — GLUCOSE, CAPILLARY
GLUCOSE-CAPILLARY: 184 mg/dL — AB (ref 65–99)
Glucose-Capillary: 103 mg/dL — ABNORMAL HIGH (ref 65–99)
Glucose-Capillary: 128 mg/dL — ABNORMAL HIGH (ref 65–99)
Glucose-Capillary: 150 mg/dL — ABNORMAL HIGH (ref 65–99)

## 2017-01-21 LAB — BASIC METABOLIC PANEL
ANION GAP: 10 (ref 5–15)
BUN: 20 mg/dL (ref 6–20)
CALCIUM: 8.2 mg/dL — AB (ref 8.9–10.3)
CO2: 25 mmol/L (ref 22–32)
CREATININE: 1.11 mg/dL — AB (ref 0.44–1.00)
Chloride: 103 mmol/L (ref 101–111)
GFR, EST AFRICAN AMERICAN: 50 mL/min — AB (ref >60)
GFR, EST NON AFRICAN AMERICAN: 43 mL/min — AB (ref >60)
Glucose, Bld: 185 mg/dL — ABNORMAL HIGH (ref 65–99)
Potassium: 3.5 mmol/L (ref 3.5–5.1)
SODIUM: 138 mmol/L (ref 135–145)

## 2017-01-21 LAB — IRON AND TIBC
Iron: 62 ug/dL (ref 28–170)
SATURATION RATIOS: 20 % (ref 10.4–31.8)
TIBC: 314 ug/dL (ref 250–450)
UIBC: 252 ug/dL

## 2017-01-21 LAB — CBC
HCT: 33.9 % — ABNORMAL LOW (ref 36.0–46.0)
HEMOGLOBIN: 11.1 g/dL — AB (ref 12.0–15.0)
MCH: 30.2 pg (ref 26.0–34.0)
MCHC: 32.7 g/dL (ref 30.0–36.0)
MCV: 92.1 fL (ref 78.0–100.0)
PLATELETS: 226 10*3/uL (ref 150–400)
RBC: 3.68 MIL/uL — AB (ref 3.87–5.11)
RDW: 15.1 % (ref 11.5–15.5)
WBC: 7.6 10*3/uL (ref 4.0–10.5)

## 2017-01-21 LAB — ECHOCARDIOGRAM COMPLETE
HEIGHTINCHES: 62 in
WEIGHTICAEL: 2480 [oz_av]

## 2017-01-21 LAB — VITAMIN B12: VITAMIN B 12: 2564 pg/mL — AB (ref 180–914)

## 2017-01-21 LAB — RETICULOCYTES
RBC.: 3.81 MIL/uL — ABNORMAL LOW (ref 3.87–5.11)
RETIC CT PCT: 1.7 % (ref 0.4–3.1)
Retic Count, Absolute: 64.8 10*3/uL (ref 19.0–186.0)

## 2017-01-21 LAB — TROPONIN I
TROPONIN I: 0.1 ng/mL — AB (ref <0.03)
TROPONIN I: 0.09 ng/mL — AB (ref <0.03)
Troponin I: 0.07 ng/mL (ref <0.03)

## 2017-01-21 LAB — FOLATE: Folate: 13.3 ng/mL (ref >5.9)

## 2017-01-21 LAB — FERRITIN: Ferritin: 34 ng/mL (ref 11–307)

## 2017-01-21 MED ORDER — AZITHROMYCIN 250 MG PO TABS
500.0000 mg | ORAL_TABLET | Freq: Every day | ORAL | Status: DC
  Administered 2017-01-21 – 2017-01-22 (×2): 500 mg via ORAL
  Filled 2017-01-21 (×2): qty 2

## 2017-01-21 MED ORDER — SODIUM CHLORIDE 0.9 % IV SOLN: INTRAVENOUS | Status: DC

## 2017-01-21 NOTE — Progress Notes (Signed)
  Echocardiogram 2D Echocardiogram has been performed.  Darlina Sicilian M 01/21/2017, 9:56 AM

## 2017-01-21 NOTE — Care Management Note (Signed)
Case Management Note  Patient Details  Name: Christine Pratt MRN: [id number] Date of Birth: Mar 23, 1925  Subjective/Objective:                  pna  Action/Plan: Date: January 21, 2017 Christine Pratt, BSN, [place], [place]  [phone] Chart and notes review for patient progress and needs. Will follow for case management and discharge needs. Next review date: [id number]  Expected Discharge Date:                  Expected Discharge Plan:  Home/Self Care  In-House Referral:     Discharge planning Services  CM Consult  Post Acute Care Choice:    Choice offered to:     DME Arranged:    DME Agency:     HH Arranged:    [place] Agency:     Status of Service:  In process, will continue to follow  If discussed at Long Length of Stay Meetings, dates discussed:    Additional Comments:  Leeroy Cha, RN 01/21/2017, 8:45 AM

## 2017-01-21 NOTE — Progress Notes (Addendum)
PROGRESS NOTE        PATIENT DETAILS Name: Christine Pratt Age: 83 y.o. Sex: female Date of Birth: 1925/08/01 Admit Date: 01/20/2017 Admitting Physician Rise Patience, MD UYZ:[id number], Nathen May, MD  Brief Narrative: Patient is a 83 y.o. female with known history of pulmonary fibrosis, chronic hypoxemic respiratory failure on home O2, CAD status post PCI, brought to the hospital by family for evaluation of worsening shortness of breath, fatigue. Found to have pneumonia on chest x-ray and admitted to the hospitalist service. See below for further details  Subjective: No chest pain-no shortness of breath at rest-it is mostly on exertion. Has not noticed fevers.  Assessment/Plan: Community-acquired pneumonia: Continue current antimicrobial therapy-await culture data. She appears chronically sick looking but does not look acutely sick.  Likely asymptomatic bacteriuria: Denies dysuria-her symptoms are mostly respiratory-any event she is on antimicrobial therapy that should cover her UTI.  Fatigue/weakness/worsening shortness of breath: Suspect mostly due to pneumonia and underlying pulmonary fibrosis. Suspect advanced age and debility also playing a role.However has significant right-sided heart failure on echocardiogram that was not seen a few years back.  Discussed case with primary pulmonologist-Dr. Lamonte Sakai, who thought that this was probably just progression of her underlying lung disease and cor pulmonale, he suggested that we start initiating palliative care/hospice discussion.  We initially contemplated doing a CT chest to not only evaluate the pulmonary parenchyma, but also to rule out a PE-but upon further chart review with Dr. Lamonte Sakai, he felt that patient's overall prognosis is very poor, and that this probably is Corpulmonale from underlying interstitial lung disease.  I have consulted palliative care.  Long discussion with family-daughter and 2 granddaughters at  bedside, who obviously were upset with the 2D echocardiogram findings, however they do understand that patient's overall prognosis is poor.  After much discussion, they have agreed to sit down with palliative care, I did bring up advance directives-to see if family was ready for a DNR-at this time family has decided to talk amongst themselves, and will get back to [place].  Presyncope: Suspect that this is probably due to underlying severe pulmonary hypertension-see above discussion-have consulted palliative care after talking with the patient's primary pulmonologist.    History of CAD: Currently without any anginal symptoms-recently to stress test earlier this year is negative.  Hypertension: BP controlled-Continue with amlodipine and lisinopril  DM-2: apparently was diagnosed with Diabetes recently-and started on Amaryl-family suspicious that Amaryl may be the cause of her symptoms.  We will check an A1c, and as long as it is not exceedingly high-we could just manage with observation rather than treatment-especially with the above-noted findings.  Nondisplaced fracture of the left fifth metatarsal: Continue supportive care, outpatient follow-up with orthopedics  Goals of care: See above-await palliative care discussion.   DVT Prophylaxis: Prophylactic Lovenox   Code Status: Full code   Family Communication: Daughter and 2 granddaughters at bedside.  Disposition Plan: Remain inpatient-suspect discharge with palliative care follow-up.  Antimicrobial agents: Anti-infectives (From admission, onward)   Start     Dose/Rate Route Frequency Ordered Stop   01/21/17 2200  azithromycin (ZITHROMAX) tablet 500 mg     500 mg Oral Daily at bedtime 01/21/17 0850 01/27/17 2159   01/21/17 2000  azithromycin (ZITHROMAX) 500 mg in dextrose 5 % 250 mL IVPB  Status:  Discontinued     500 mg 250  mL/hr over 60 Minutes Intravenous Every 24 hours 01/20/17 2146 01/21/17 0850   01/21/17 1800  cefTRIAXone  (ROCEPHIN) 1 g in dextrose 5 % 50 mL IVPB     1 g 100 mL/hr over 30 Minutes Intravenous Every 24 hours 01/20/17 2146 01/28/17 1759   01/20/17 1845  azithromycin (ZITHROMAX) 500 mg in dextrose 5 % 250 mL IVPB     500 mg 250 mL/hr over 60 Minutes Intravenous  Once 01/20/17 1836 01/20/17 2106   01/20/17 1815  cefTRIAXone (ROCEPHIN) 1 g in dextrose 5 % 50 mL IVPB     1 g 100 mL/hr over 30 Minutes Intravenous  Once 01/20/17 1811 01/20/17 1932      Procedures: 11/8>>Echo: - Left ventricle: The cavity size was normal. Wall thickness was   normal. Systolic function was normal. The estimated ejection   fraction was in the range of 60% to 65%. Wall motion was normal;   there were no regional wall motion abnormalities. Doppler   parameters are consistent with elevated mean left atrial filling   pressure. - Ventricular septum: Septal motion showed paradox. The contour   showed diastolic flattening and systolic flattening. These   changes are consistent with RV volume and pressure overload. - Mitral valve: Calcified annulus. - Right ventricle: The cavity size was moderately dilated. Systolic   function was mildly reduced. - Tricuspid valve: There was moderate regurgitation. - Pulmonary arteries: Systolic pressure was severely increased. PA   peak pressure: 93 mm Hg (S). - Pericardium, extracardiac: A trivial pericardial effusion was   identified.   CONSULTS:  Discussed with Dr. Lamonte Sakai over the phone, who reviewed patient's chart.  Time spent: 35 minutes-Greater than 50% of this time was spent in counseling, explanation of diagnosis, planning of further management, and coordination of care.  MEDICATIONS: Scheduled Meds: . amLODipine  5 mg Oral Daily  . atorvastatin  10 mg Oral Daily  . azithromycin  500 mg Oral QHS  . enoxaparin (LOVENOX) injection  30 mg Subcutaneous Q24H  . escitalopram  20 mg Oral Daily  . furosemide  20 mg Oral Daily  . insulin aspart  0-9 Units Subcutaneous TID  WC  . lisinopril  20 mg Oral Daily  . LORazepam  1 mg Oral Daily  . oxyCODONE  20 mg Oral TID   Continuous Infusions: . cefTRIAXone (ROCEPHIN)  IV     PRN Meds:.acetaminophen **OR** acetaminophen, albuterol, nitroGLYCERIN, ondansetron **OR** ondansetron (ZOFRAN) IV, polyethylene glycol powder   PHYSICAL EXAM: Vital signs: Vitals:   01/21/17 0557 01/21/17 0755 01/21/17 1124 01/21/17 1350  BP: (!) 128/99 (!) 167/107  (!) 149/84  Pulse: (!) 107 73 74 79  Resp: 17     Temp: 98.1 F (36.7 C) 98.6 F (37 C)  98.6 F (37 C)  TempSrc: Oral Oral  Oral  SpO2: 90% 100% 95% 96%  Weight:      Height:       Filed Weights   01/20/17 2352  Weight: 70.3 kg (155 lb)   Body mass index is 28.35 kg/m.   General appearance :Awake, alert, not in any distress.  Chronically sick appearing-appears frail. Eyes:, pupils equally reactive to light and accomodation,no scleral icterus. HEENT: Atraumatic and Normocephalic Neck: supple, no JVD. No cervical lymphadenopathy. Resp:Good air entry bilaterally, scattered inspiratory rales mostly. CVS: S1 S2 regular GI: Bowel sounds present, Non tender and not distended with no gaurding, rigidity or rebound.No organomegaly Extremities: B/L Lower Ext shows no edema, both legs are warm to touch  Neurology:  speech clear,Non focal, sensation is grossly intact. Psychiatric: Normal judgment and insight. Alert and oriented x 3.  Musculoskeletal:No digital cyanosis Skin:No Rash, warm and dry Wounds:N/A  I have personally reviewed following labs and imaging studies  LABORATORY DATA: CBC: Recent Labs  Lab 01/20/17 1502 01/21/17 0127  WBC 8.9 7.6  HGB 12.4 11.1*  HCT 38.1 33.9*  MCV 93.6 92.1  PLT 257 588    Basic Metabolic Panel: Recent Labs  Lab 01/20/17 1502 01/21/17 0127  NA 140 138  K 4.0 3.5  CL 102 103  CO2 26 25  GLUCOSE 181* 185*  BUN 24* 20  CREATININE 1.34* 1.11*  CALCIUM 8.6* 8.2*    GFR: Estimated Creatinine Clearance: 31.6  mL/min (A) (by C-G formula based on SCr of 1.11 mg/dL (H)).  Liver Function Tests: No results for input(s): AST, ALT, ALKPHOS, BILITOT, PROT, ALBUMIN in the last 168 hours. No results for input(s): LIPASE, AMYLASE in the last 168 hours. No results for input(s): AMMONIA in the last 168 hours.  Coagulation Profile: No results for input(s): INR, PROTIME in the last 168 hours.  Cardiac Enzymes: Recent Labs  Lab 01/21/17 0127 01/21/17 0724  TROPONINI 0.10* 0.09*    BNP (last 3 results) No results for input(s): PROBNP in the last 8760 hours.  HbA1C: No results for input(s): HGBA1C in the last 72 hours.  CBG: Recent Labs  Lab 01/20/17 1515 01/20/17 2346 01/21/17 0736 01/21/17 1138  GLUCAP 156* 136* 103* 184*    Lipid Profile: No results for input(s): CHOL, HDL, LDLCALC, TRIG, CHOLHDL, LDLDIRECT in the last 72 hours.  Thyroid Function Tests: No results for input(s): TSH, T4TOTAL, FREET4, T3FREE, THYROIDAB in the last 72 hours.  Anemia Panel: Recent Labs    01/21/17 0724  VITAMINB12 2,564*  FOLATE 13.3  FERRITIN 34  TIBC 314  IRON 62  RETICCTPCT 1.7    Urine analysis:    Component Value Date/Time   COLORURINE YELLOW 01/20/2017 1723   APPEARANCEUR HAZY (A) 01/20/2017 1723   LABSPEC 1.008 01/20/2017 1723   PHURINE 6.0 01/20/2017 1723   GLUCOSEU NEGATIVE 01/20/2017 1723   HGBUR NEGATIVE 01/20/2017 1723   BILIRUBINUR NEGATIVE 01/20/2017 1723   KETONESUR NEGATIVE 01/20/2017 1723   PROTEINUR NEGATIVE 01/20/2017 1723   NITRITE POSITIVE (A) 01/20/2017 1723   LEUKOCYTESUR TRACE (A) 01/20/2017 1723    Sepsis Labs: Lactic Acid, Venous No results found for: LATICACIDVEN  MICROBIOLOGY: No results found for this or any previous visit (from the past 240 hour(s)).  RADIOLOGY STUDIES/RESULTS: Dg Chest 2 View  Result Date: 01/20/2017 CLINICAL DATA:  Chest pain and dyspnea x3 days. EXAM: CHEST  2 VIEW COMPARISON:  07/21/2016 chest CT, CXR 05/17/2013 FINDINGS: The  cardiopericardial silhouette is top-normal in size with mild-to-moderate atherosclerosis of the aortic arch. No aneurysm is noted. Diffuse interstitial prominence of lung markings with basilar predominance is again noted consistent with changes of interstitial fibrosis. Superimposed areas of atelectasis and mild central vascular congestion are identified. Subtle subpleural airspace opacity in the anterior right middle lobe is suggested some which is likely related to atelectasis and/or fibrosis. A mild superimposed pneumonia is not excluded. No pneumothorax is seen. Chronic degenerative change of the thoracic spine. IMPRESSION: Aortic atherosclerosis with chronic prominence of interstitial lung markings consistent with fibrosis. Atelectasis and superimposed subtle pneumonia in the right middle lobe distribution is suggested on today's exam. Electronically Signed   By: Ashley Royalty M.D.   On: 01/20/2017 18:29   Ct Head Wo Contrast  Result Date: 01/20/2017 CLINICAL DATA:  Altered level of consciousness EXAM: CT HEAD WITHOUT CONTRAST TECHNIQUE: Contiguous axial images were obtained from the base of the skull through the vertex without intravenous contrast. COMPARISON:  None. FINDINGS: Brain: No large territorial infarction or mass is visualized. No definitive acute hemorrhage is seen. Focal hyperdensity within the right cerebellum measuring 4 mm. No significant mass effect or surrounding edema. Mild basal ganglial calcification on the right. Subtle asymmetric hyperdensity of the right caudate. Old encephalomalacia involving the right temporal and occipital lobes. Moderate atrophy. Moderate small vessel ischemic changes of the white matter. Prominent ventricles likely due to atrophy. Vascular: No hyperdense vessels.  Carotid artery calcification. Skull: No fracture or suspicious bone lesion. Sinuses/Orbits: No acute finding. Other: None IMPRESSION: 1. No definite CT evidence for acute intracranial abnormality. 2. 4  mm hyperdense focus in the right cerebellum, doubtful for hemorrhage, suspect that this may represent a small calcified mass such as cavernoma or focus of old hemosiderin product. MRI could be obtained for further evaluation. 3. Asymmetric hyperdensity of the right greater than left caudate nuclei, could reflect unusual appearance of idiopathic basal ganglial calcification, but given history, can also be seen in non ketotic hyperglycemia; MRI could also be obtained to further evaluate. 4. 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 Atrophy and moderate small vessel ischemic changes of the white matter. Electronically Signed   By: Donavan Foil M.D.   On: 01/20/2017 18:17   Dg Foot Complete Left  Result Date: 01/20/2017 CLINICAL DATA:  Weakness, pain and swelling base of fifth metatarsal EXAM: LEFT FOOT - COMPLETE 3+ VIEW COMPARISON:  None. FINDINGS: Irregular lucency at the base of fifth metatarsal consistent with nondisplaced fracture. No definitive articular extension on the views submitted. Moderate bunion formation and degenerative changes at the first MTP joint. Moderate plantar calcaneal spur. Vascular calcifications IMPRESSION: 1. Nondisplaced fracture at the base of the fifth metatarsal 2. Degenerative changes at the first MTP joint with moderate bunion formation at the head of the first metatarsal Electronically Signed   By: Donavan Foil M.D.   On: 01/20/2017 18:26     LOS: 0 days   Oren Binet, MD  [facility] Pager:[phone]  If 7PM-7AM, please contact night-coverage [email or web address] Password [email or web address] 01/21/2017, 3:15 PM

## 2017-01-21 NOTE — Progress Notes (Signed)
Family member does not want patient to take insulin, lipitor and glimeriprime

## 2017-01-21 NOTE — Progress Notes (Signed)
CRITICAL VALUE ALERT  Critical Value:  Troponin, 0.10  Date & Time Notied:  01/21/17, 0300  Provider Notified: Baltazar Najjar, NP  Orders Received/Actions taken:

## 2017-01-21 NOTE — Progress Notes (Signed)
PT Cancellation Note  Patient Details Name: SERAFINA TOPHAM MRN: [id number] DOB: 1925-07-21   Cancelled Treatment:    Reason Eval/Treat Not Completed: Attemtped PT eval. Family declined participation with PT on today. Will check back another day.    Weston Anna, MPT Pager: [phone]

## 2017-01-21 NOTE — Progress Notes (Signed)
PHARMACIST - PHYSICIAN COMMUNICATION DR:   Sloan Leiter CONCERNING: Antibiotic IV to Oral Route Change Policy  RECOMMENDATION: This patient is receiving azithromycin by the intravenous route.  Based on criteria approved by the Pharmacy and Therapeutics Committee, the antibiotic(s) is/are being converted to the equivalent oral dose form(s).   DESCRIPTION: These criteria include:  Patient being treated for a respiratory tract infection, urinary tract infection, cellulitis or clostridium difficile associated diarrhea if on metronidazole  The patient is not neutropenic and does not exhibit a GI malabsorption state  The patient is eating (either orally or via tube) and/or has been taking other orally administered medications for a least 24 hours  The patient is improving clinically and has a Tmax < 100.5  If you have questions about this conversion, please contact the Pharmacy Department  _0   [phone] )  [facility] _1   [phone] )  [facility] _2   [phone] )  [facility] _3   [phone] )  [facility] _4   [phone] )  [place], [place].D. [phone] 01/21/2017 8:50 AM

## 2017-01-22 ENCOUNTER — Inpatient Hospital Stay (HOSPITAL_COMMUNITY): Payer: Medicare Other

## 2017-01-22 DIAGNOSIS — J84112 Idiopathic pulmonary fibrosis: Secondary | ICD-10-CM

## 2017-01-22 DIAGNOSIS — N390 Urinary tract infection, site not specified: Secondary | ICD-10-CM

## 2017-01-22 DIAGNOSIS — J181 Lobar pneumonia, unspecified organism: Secondary | ICD-10-CM

## 2017-01-22 DIAGNOSIS — J9621 Acute and chronic respiratory failure with hypoxia: Principal | ICD-10-CM

## 2017-01-22 DIAGNOSIS — Z515 Encounter for palliative care: Secondary | ICD-10-CM

## 2017-01-22 LAB — CBC
HEMATOCRIT: 36.7 % (ref 36.0–46.0)
HEMOGLOBIN: 11.9 g/dL — AB (ref 12.0–15.0)
MCH: 30.1 pg (ref 26.0–34.0)
MCHC: 32.4 g/dL (ref 30.0–36.0)
MCV: 92.9 fL (ref 78.0–100.0)
Platelets: 238 10*3/uL (ref 150–400)
RBC: 3.95 MIL/uL (ref 3.87–5.11)
RDW: 15.1 % (ref 11.5–15.5)
WBC: 8.2 10*3/uL (ref 4.0–10.5)

## 2017-01-22 LAB — BASIC METABOLIC PANEL
ANION GAP: 8 (ref 5–15)
BUN: 21 mg/dL — ABNORMAL HIGH (ref 6–20)
CO2: 28 mmol/L (ref 22–32)
Calcium: 8.8 mg/dL — ABNORMAL LOW (ref 8.9–10.3)
Chloride: 103 mmol/L (ref 101–111)
Creatinine, Ser: 1.24 mg/dL — ABNORMAL HIGH (ref 0.44–1.00)
GFR calc non Af Amer: 37 mL/min — ABNORMAL LOW (ref >60)
GFR, EST AFRICAN AMERICAN: 43 mL/min — AB (ref >60)
GLUCOSE: 143 mg/dL — AB (ref 65–99)
POTASSIUM: 4.1 mmol/L (ref 3.5–5.1)
Sodium: 139 mmol/L (ref 135–145)

## 2017-01-22 LAB — RESPIRATORY PANEL BY PCR
ADENOVIRUS-RVPPCR: NOT DETECTED
Bordetella pertussis: NOT DETECTED
CORONAVIRUS 229E-RVPPCR: NOT DETECTED
CORONAVIRUS NL63-RVPPCR: NOT DETECTED
CORONAVIRUS OC43-RVPPCR: NOT DETECTED
Chlamydophila pneumoniae: NOT DETECTED
Coronavirus HKU1: NOT DETECTED
INFLUENZA B-RVPPCR: NOT DETECTED
Influenza A: NOT DETECTED
METAPNEUMOVIRUS-RVPPCR: NOT DETECTED
MYCOPLASMA PNEUMONIAE-RVPPCR: NOT DETECTED
PARAINFLUENZA VIRUS 1-RVPPCR: NOT DETECTED
PARAINFLUENZA VIRUS 2-RVPPCR: NOT DETECTED
PARAINFLUENZA VIRUS 4-RVPPCR: NOT DETECTED
Parainfluenza Virus 3: NOT DETECTED
RESPIRATORY SYNCYTIAL VIRUS-RVPPCR: NOT DETECTED
Rhinovirus / Enterovirus: NOT DETECTED

## 2017-01-22 LAB — GLUCOSE, CAPILLARY
GLUCOSE-CAPILLARY: 144 mg/dL — AB (ref 65–99)
Glucose-Capillary: 132 mg/dL — ABNORMAL HIGH (ref 65–99)
Glucose-Capillary: 137 mg/dL — ABNORMAL HIGH (ref 65–99)

## 2017-01-22 LAB — HEMOGLOBIN A1C
Hgb A1c MFr Bld: 7.4 % — ABNORMAL HIGH (ref 4.8–5.6)
MEAN PLASMA GLUCOSE: 165.68 mg/dL

## 2017-01-22 LAB — STREP PNEUMONIAE URINARY ANTIGEN: Strep Pneumo Urinary Antigen: NEGATIVE

## 2017-01-22 LAB — PROCALCITONIN: Procalcitonin: <0.1 ng/mL

## 2017-01-22 NOTE — Consult Note (Signed)
Consultation Note Date: 01/22/2017   Patient Name: Christine Pratt  DOB: 07/11/1925  MRN: [id number]  Age / Sex: 83 y.o., female  PCP: Mayra Neer, MD Referring Physician: Jonetta Osgood, MD  Reason for Consultation: Establishing goals of care and Psychosocial/spiritual support  HPI/Patient Profile: 83 y.o. female  with past medical history of pulmonary fibrosis ( DX in 2015), CAD s/p stent, CKD, DM, HTN, old CVA admitted on 01/20/2017 with increased shortness of breath, near syncope. Pt was found to have PNA as well as UTI, left foot 5th metatarsal fx  Consult for GOC.   Clinical Assessment and Goals of Care: Met with pt, and granddaughter, Joellen Jersey. Pt and granddaughter describe a sharp decline over the past month. Despite pulmonary fibrosis, pt has been ambulatory, going out with family. Now, very short of breath with any exertion. Pt lives with her daughter, Izora Gala, with primary caregivers of 2 granddaughters, as well as daughter, Nyoka Cowden. Pt also has a son who lives in [place]. Per granddaughter, Alek Poncedeleon is her healthcare proxy  Pt can still speak for herself. Her daughter, Lesbia Ottaway is her healthcare proxy    SUMMARY OF RECOMMENDATIONS   Cont with Full Code Family mtg scheduled for 11/10 at [place]:  Full code    Symptom Management:   Dyspnea: Cont with targeted pulmonary treatments; would recommend low dose MS04 ( creatinne 1.1) 2.5-5 mg morphine concentrate for acute dyspnea  Palliative Prophylaxis:   Aspiration, Bowel Regimen, Delirium Protocol, Eye Care, Frequent Pain Assessment, Oral Care and Turn Reposition  Psycho-social/Spiritual:   Desire for further Chaplaincy support:no  Additional Recommendations: Grief/Bereavement Support  Prognosis:   < 6 months in the setting of end stage pulmonary fibrosis, pulmonary HTN, sharp functional decline.  High risk for acute cardiopulmonary decompensation   Discharge Planning: Will return home. Will present hospice support in the home      Primary Diagnoses: Present on Admission: . Pneumonia   I have reviewed the medical record, interviewed the patient and family, and examined the patient. The following aspects are pertinent.  Past Medical History:  Diagnosis Date  . CAD S/P percutaneous coronary angioplasty 01/16/2014   a. 01/2014 Cath/PCI: LM nl, LAD 50ost, 70-67m(+ FFR--> XIENCE ALPINE DES 2.25 MM X 18 MM --> 2.4 MM, LCX min irregs, OM1 nl, RCA 20p, 50-647mmin irregs distal, RPDA/RPAV nl;  b. 04/2016 Lexiscan MV: EF 73%, no ischemia/infarct.  . CKD (chronic kidney disease), stage III ([place]  . Claudication ([place]   a. 11/2015 ABI's: R 1.25, L1.1  . Depression   . DJD (degenerative joint disease)   . Essential hypertension   . Osteoarthritis   . Pulmonary fibrosis (HCC)    Interstitial lung disease on home oxygen; mild to moderate pulmonary hypertension by RHC (PAP 40/18 mmHg)  . Pulmonary nodule   . Spinal stenosis    Social History   Socioeconomic History  . Marital status: Widowed    Spouse name: None  . Number of children:  None  . Years of education: None  . Highest education level: None  Social Needs  . Financial resource strain: Not very hard  . Food insecurity - worry: Never true  . Food insecurity - inability: Never true  . Transportation needs - medical: No  . Transportation needs - non-medical: No  Occupational History  . Occupation: Retired  Tobacco Use  . Smoking status: Never Smoker  . Smokeless tobacco: Never Used  Substance and Sexual Activity  . Alcohol use: No  . Drug use: No  . Sexual activity: Not Currently    Partners: Male  Other Topics Concern  . None  Social History Narrative   Retired. Widowed.   Never smoked and does not drink alcohol.   Family History  Problem Relation Age of Onset  . Heart failure Sister   . Cancer Mother         kidney  . Heart attack Father   . Heart disease Brother    Scheduled Meds: . amLODipine  5 mg Oral Daily  . atorvastatin  10 mg Oral Daily  . azithromycin  500 mg Oral QHS  . enoxaparin (LOVENOX) injection  30 mg Subcutaneous Q24H  . escitalopram  20 mg Oral Daily  . furosemide  20 mg Oral Daily  . insulin aspart  0-9 Units Subcutaneous TID WC  . lisinopril  20 mg Oral Daily  . LORazepam  1 mg Oral Daily  . oxyCODONE  20 mg Oral TID   Continuous Infusions: . cefTRIAXone (ROCEPHIN)  IV Stopped (01/21/17 1908)   PRN Meds:.acetaminophen **OR** acetaminophen, albuterol, nitroGLYCERIN, ondansetron **OR** ondansetron (ZOFRAN) IV, polyethylene glycol powder Medications Prior to Admission:  Prior to Admission medications   Medication Sig Start Date End Date Taking? Authorizing Provider  albuterol (PROVENTIL) (2.5 MG/3ML) 0.083% nebulizer solution Take 2.5 mg by nebulization every 6 (six) hours as needed.  07/24/16  Yes [provider]  albuterol (VENTOLIN HFA) 108 (90 Base) MCG/ACT inhaler Inhale 2 puffs daily as needed into the lungs for wheezing or shortness of breath.   Yes [provider]  amLODipine (NORVASC) 5 MG tablet take 1 tablet by mouth once daily 05/04/16  Yes Leonie Man, MD  atorvastatin (LIPITOR) 20 MG tablet Take 0.5 tablets (10 mg total) by mouth daily. 04/30/16 01/20/17 Yes Rogelia Mire, NP  Cyanocobalamin (VITAMIN B 12 PO) Take 1 tablet daily by mouth.   Yes [provider]  escitalopram (LEXAPRO) 20 MG tablet Take 20 mg by mouth daily.    Yes [provider]  furosemide (LASIX) 20 MG tablet Take 20 mg by mouth daily.    Yes [provider]  LORazepam (ATIVAN) 1 MG tablet Take 1 mg by mouth daily.    Yes [provider]  nitroGLYCERIN (NITROSTAT) 0.4 MG SL tablet Place 1 tablet (0.4 mg total) under the tongue every 5 (five) minutes as needed for chest pain. 02/01/14  Yes Lyda Jester M, PA-C  Oxycodone  HCl 20 MG TABS Take 20 mg by mouth 3 (three) times daily.    Yes [provider]  Polyethylene Glycol 3350 (MIRALAX PO) Take 17 g by mouth daily as needed (for constipation).    Yes [provider]  quinapril (ACCUPRIL) 20 MG tablet Take 1 tablet by mouth daily.   Yes [provider]   No Known Allergies Review of Systems  Constitutional: Positive for activity change.  HENT: Negative.   Eyes: Negative.   Respiratory: Positive for shortness of breath.  Cardiovascular:       Near syncope  Endocrine: Negative.   Genitourinary: Negative.   Musculoskeletal: Negative.   Skin: Negative.   Neurological: Positive for weakness.  Hematological: Negative.   Psychiatric/Behavioral: The patient is nervous/anxious.     Physical Exam  Vital Signs: BP (!) 149/82   Pulse 68   Temp 97.7 F (36.5 C) (Oral)   Resp 18   Ht _0  (1.575 m)   Wt 70.3 kg (155 lb)   SpO2 96%   BMI 28.35 kg/m  Pain Assessment: No/denies pain   Pain Score: 0-No pain   SpO2: SpO2: 96 % O2 Device:SpO2: 96 % O2 Flow Rate: .O2 Flow Rate (L/min): 4 L/min  IO: Intake/output summary:   Intake/Output Summary (Last 24 hours) at 01/22/2017 1139 Last data filed at 01/21/2017 1724 Gross per 24 hour  Intake 170 ml  Output -  Net 170 ml    LBM: Last BM Date: 01/17/17 Baseline Weight: Weight: 70.3 kg (155 lb) Most recent weight: Weight: 70.3 kg (155 lb)     Palliative Assessment/Data:   Flowsheet Rows     Most Recent Value  Intake Tab  Referral Department  Hospitalist  Unit at Time of Referral  Med/Surg Unit  Palliative Care Primary Diagnosis  Pulmonary  Date Notified  01/21/17  Reason for referral  Clarify Goals of Care  Date of Admission  01/18/17  Date first seen by Palliative Care  01/22/17  # of days Palliative referral response time  1 Day(s)  # of days IP prior to Palliative referral  3  Clinical Assessment  Palliative Performance Scale Score  40%  Pain Max last 24 hours   Not able to report  Pain Min Last 24 hours  Not able to report  Dyspnea Max Last 24 Hours  Not able to report  Dyspnea Min Last 24 hours  Not able to report  Nausea Max Last 24 Hours  Not able to report  Nausea Min Last 24 Hours  Not able to report  Anxiety Max Last 24 Hours  Not able to report  Anxiety Min Last 24 Hours  Not able to report  Other Max Last 24 Hours  Not able to report  Psychosocial & Spiritual Assessment  Palliative Care Outcomes  Patient/Family meeting held?  Yes  Who was at the meeting?  granddaughter  Palliative Care follow-up planned  Yes, Facility      Time In: 1030 Time Out: 1110 Time Total: 70 min Greater than 50%  of this time was spent counseling and coordinating care related to the above assessment and plan. Staffed with Dr. Sloan Leiter  Signed by: Dory Horn, NP   Please contact Palliative Medicine Team phone at [phone] for questions and concerns.  For individual provider: See Shea Evans

## 2017-01-22 NOTE — Progress Notes (Signed)
PROGRESS NOTE        PATIENT DETAILS Name: Christine Pratt Age: 83 y.o. Sex: female Date of Birth: 09/03/1925 Admit Date: 01/20/2017 Admitting Physician Rise Patience, MD GNF:AOZH, Nathen May, MD  Brief Narrative: Patient is a 83 y.o. female with known history of pulmonary fibrosis, chronic hypoxemic respiratory failure on home O2, CAD status post PCI, brought to the hospital by family for evaluation of worsening shortness of breath, fatigue. Thought to have pneumonia on chest x-ray and admitted to the hospitalist service. Echocardiogram showed severe pulmonary hypertension-after discussion with pulmonology, palliative care consulted, pulmonary planning on getting a HRCT scan of the chest.  See below for further details  Subjective: Thinks her shortness of breath is better. However she has not yet ambulated.  Assessment/Plan: Acute on chronic hypoxic respiratory failure: Felt to be due to either pneumonia or a flare of underlying interstitial lung disease. Continue O2 supplementation (on 4 L at home), continue with empiric antimicrobial therapy. Seen by pulmonology today, plans are to pursue it HRCT of the chest. Await respiratory virus panel.  Community-acquired pneumonia: Continue current antimicrobial therapy-see above  Likely asymptomatic bacteriuria: Denies dysuria-her symptoms are mostly respiratory-any event she is on antimicrobial therapy that should cover her UTI.  Interstitial lung disease: Followed by Dr. Lamonte Sakai in the outpatient setting-now appears to have developed severe pulmonary hypertension and right heart failure probably group 3 pulmonary hypertension-in a setting of underlying lung disease. Seen by Dr. Chase Caller today-with plans to pursue it HRCT-if shows active inflammation-he plans on a three-week course of steroids.  Severe pulmonary hypertension: Felt to be secondary to underlying interstitial lung disease-very poor long-term  prognoses-palliative care consulted for goals of care.  Presyncope: Suspect that this is probably due to underlying severe pulmonary hypertension-see above discussion-have consulted palliative care after talking with the patient's primary pulmonologist.    History of CAD: Currently without any anginal symptoms-recently to stress test earlier this year is negative.  Hypertension: BP controlled-Continue with amlodipine and lisinopril  DM-2: apparently was diagnosed with Diabetes recently-and started on Amaryl-family suspicious that Amaryl may be the cause of her symptoms. A1c 7.4, her CBGs are relatively stable with the readings less than 200-I suspect given her poor long-term prognoses/frailty and advanced age-we could allow the A1c to track up to 8 and monitor off oral hypoglycemic agents  Nondisplaced fracture of the left fifth metatarsal: Continue supportive care, outpatient follow-up with orthopedics  Goals of care: See above-await palliative care discussion.   DVT Prophylaxis: Prophylactic Lovenox   Code Status: Full code   Family Communication: Granddaughter at bedside.  Disposition Plan: Remain inpatient-suspect discharge with palliative care follow-up.  Antimicrobial agents: Anti-infectives (From admission, onward)   Start     Dose/Rate Route Frequency Ordered Stop   01/21/17 2200  azithromycin (ZITHROMAX) tablet 500 mg     500 mg Oral Daily at bedtime 01/21/17 0850 01/27/17 2159   01/21/17 2000  azithromycin (ZITHROMAX) 500 mg in dextrose 5 % 250 mL IVPB  Status:  Discontinued     500 mg 250 mL/hr over 60 Minutes Intravenous Every 24 hours 01/20/17 2146 01/21/17 0850   01/21/17 1800  cefTRIAXone (ROCEPHIN) 1 g in dextrose 5 % 50 mL IVPB     1 g 100 mL/hr over 30 Minutes Intravenous Every 24 hours 01/20/17 2146 01/28/17 1759   01/20/17 1845  azithromycin (ZITHROMAX)  500 mg in dextrose 5 % 250 mL IVPB     500 mg 250 mL/hr over 60 Minutes Intravenous  Once 01/20/17 1836  01/20/17 2106   01/20/17 1815  cefTRIAXone (ROCEPHIN) 1 g in dextrose 5 % 50 mL IVPB     1 g 100 mL/hr over 30 Minutes Intravenous  Once 01/20/17 1811 01/20/17 1932      Procedures: 11/8>>Echo: - Left ventricle: The cavity size was normal. Wall thickness was   normal. Systolic function was normal. The estimated ejection   fraction was in the range of 60% to 65%. Wall motion was normal;   there were no regional wall motion abnormalities. Doppler   parameters are consistent with elevated mean left atrial filling   pressure. - Ventricular septum: Septal motion showed paradox. The contour   showed diastolic flattening and systolic flattening. These   changes are consistent with RV volume and pressure overload. - Mitral valve: Calcified annulus. - Right ventricle: The cavity size was moderately dilated. Systolic   function was mildly reduced. - Tricuspid valve: There was moderate regurgitation. - Pulmonary arteries: Systolic pressure was severely increased. PA   peak pressure: 93 mm Hg (S). - Pericardium, extracardiac: A trivial pericardial effusion was   identified.   CONSULTS: PCCM  Time spent: 25 minutes-Greater than 50% of this time was spent in counseling, explanation of diagnosis, planning of further management, and coordination of care.  MEDICATIONS: Scheduled Meds: . amLODipine  5 mg Oral Daily  . atorvastatin  10 mg Oral Daily  . azithromycin  500 mg Oral QHS  . enoxaparin (LOVENOX) injection  30 mg Subcutaneous Q24H  . escitalopram  20 mg Oral Daily  . furosemide  20 mg Oral Daily  . insulin aspart  0-9 Units Subcutaneous TID WC  . lisinopril  20 mg Oral Daily  . LORazepam  1 mg Oral Daily  . oxyCODONE  20 mg Oral TID   Continuous Infusions: . cefTRIAXone (ROCEPHIN)  IV Stopped (01/21/17 1908)   PRN Meds:.acetaminophen **OR** acetaminophen, albuterol, nitroGLYCERIN, ondansetron **OR** ondansetron (ZOFRAN) IV, polyethylene glycol powder   PHYSICAL  EXAM: Vital signs: Vitals:   01/22/17 0552 01/22/17 0930 01/22/17 1003 01/22/17 1026  BP: (!) 161/82 (!) 149/82 (!) 149/82 (!) 149/82  Pulse: 68     Resp: 18     Temp: 97.7 F (36.5 C)     TempSrc: Oral     SpO2: 96%     Weight:      Height:       Filed Weights   01/20/17 2352  Weight: 70.3 kg (155 lb)   Body mass index is 28.35 kg/m.   General appearance :Awake, alert, not in any distress.  Eyes:, pupils equally reactive to light and accomodation,no scleral icterus. HEENT: Atraumatic and Normocephalic Neck: supple, no JVD. Resp:Good air entry bilaterally, inspiratory rales all over CVS: S1 S2 regular, no murmurs.  GI: Bowel sounds present, Non tender and not distended with no gaurding, rigidity or rebound. Extremities: B/L Lower Ext shows no edema, both legs are warm to touch Neurology:  speech clear,Non focal, sensation is grossly intact. Psychiatric: Normal judgment and insight. Normal mood. Musculoskeletal:No digital cyanosis Skin:No Rash, warm and dry Wounds:N/A  I have personally reviewed following labs and imaging studies  LABORATORY DATA: CBC: Recent Labs  Lab 01/20/17 1502 01/21/17 0127 01/22/17 0753  WBC 8.9 7.6 8.2  HGB 12.4 11.1* 11.9*  HCT 38.1 33.9* 36.7  MCV 93.6 92.1 92.9  PLT 257 226 238  Basic Metabolic Panel: Recent Labs  Lab 01/20/17 1502 01/21/17 0127 01/22/17 0753  NA 140 138 139  K 4.0 3.5 4.1  CL 102 103 103  CO2 _0 GLUCOSE 181* 185* 143*  BUN 24* 20 21*  CREATININE 1.34* 1.11* 1.24*  CALCIUM 8.6* 8.2* 8.8*    GFR: Estimated Creatinine Clearance: 28.3 mL/min (A) (by C-G formula based on SCr of 1.24 mg/dL (H)).  Liver Function Tests: No results for input(s): AST, ALT, ALKPHOS, BILITOT, PROT, ALBUMIN in the last 168 hours. No results for input(s): LIPASE, AMYLASE in the last 168 hours. No results for input(s): AMMONIA in the last 168 hours.  Coagulation Profile: No results for input(s): INR, PROTIME in the last  168 hours.  Cardiac Enzymes: Recent Labs  Lab 01/21/17 0127 01/21/17 0724 01/21/17 1340  TROPONINI 0.10* 0.09* 0.07*    BNP (last 3 results) No results for input(s): PROBNP in the last 8760 hours.  HbA1C: Recent Labs    01/22/17 0753  HGBA1C 7.4*    CBG: Recent Labs  Lab 01/21/17 1138 01/21/17 1657 01/21/17 2144 01/22/17 0748 01/22/17 1200  GLUCAP 184* 128* 150* 132* 137*    Lipid Profile: No results for input(s): CHOL, HDL, LDLCALC, TRIG, CHOLHDL, LDLDIRECT in the last 72 hours.  Thyroid Function Tests: No results for input(s): TSH, T4TOTAL, FREET4, T3FREE, THYROIDAB in the last 72 hours.  Anemia Panel: Recent Labs    01/21/17 0724  VITAMINB12 2,564*  FOLATE 13.3  FERRITIN 34  TIBC 314  IRON 62  RETICCTPCT 1.7    Urine analysis:    Component Value Date/Time   COLORURINE YELLOW 01/20/2017 1723   APPEARANCEUR HAZY (A) 01/20/2017 1723   LABSPEC 1.008 01/20/2017 1723   PHURINE 6.0 01/20/2017 1723   GLUCOSEU NEGATIVE 01/20/2017 1723   HGBUR NEGATIVE 01/20/2017 1723   BILIRUBINUR NEGATIVE 01/20/2017 1723   KETONESUR NEGATIVE 01/20/2017 1723   PROTEINUR NEGATIVE 01/20/2017 1723   NITRITE POSITIVE (A) 01/20/2017 1723   LEUKOCYTESUR TRACE (A) 01/20/2017 1723    Sepsis Labs: Lactic Acid, Venous No results found for: LATICACIDVEN  MICROBIOLOGY: No results found for this or any previous visit (from the past 240 hour(s)).  RADIOLOGY STUDIES/RESULTS: Dg Chest 2 View  Result Date: 01/20/2017 CLINICAL DATA:  Chest pain and dyspnea x3 days. EXAM: CHEST  2 VIEW COMPARISON:  07/21/2016 chest CT, CXR 05/17/2013 FINDINGS: The cardiopericardial silhouette is top-normal in size with mild-to-moderate atherosclerosis of the aortic arch. No aneurysm is noted. Diffuse interstitial prominence of lung markings with basilar predominance is again noted consistent with changes of interstitial fibrosis. Superimposed areas of atelectasis and mild central vascular congestion  are identified. Subtle subpleural airspace opacity in the anterior right middle lobe is suggested some which is likely related to atelectasis and/or fibrosis. A mild superimposed pneumonia is not excluded. No pneumothorax is seen. Chronic degenerative change of the thoracic spine. IMPRESSION: Aortic atherosclerosis with chronic prominence of interstitial lung markings consistent with fibrosis. Atelectasis and superimposed subtle pneumonia in the right middle lobe distribution is suggested on today's exam. Electronically Signed   By: Ashley Royalty M.D.   On: 01/20/2017 18:29   Ct Head Wo Contrast  Result Date: 01/20/2017 CLINICAL DATA:  Altered level of consciousness EXAM: CT HEAD WITHOUT CONTRAST TECHNIQUE: Contiguous axial images were obtained from the base of the skull through the vertex without intravenous contrast. COMPARISON:  None. FINDINGS: Brain: No large territorial infarction or mass is visualized. No definitive acute hemorrhage is seen. Focal hyperdensity within  the right cerebellum measuring 4 mm. No significant mass effect or surrounding edema. Mild basal ganglial calcification on the right. Subtle asymmetric hyperdensity of the right caudate. Old encephalomalacia involving the right temporal and occipital lobes. Moderate atrophy. Moderate small vessel ischemic changes of the white matter. Prominent ventricles likely due to atrophy. Vascular: No hyperdense vessels.  Carotid artery calcification. Skull: No fracture or suspicious bone lesion. Sinuses/Orbits: No acute finding. Other: None IMPRESSION: 1. No definite CT evidence for acute intracranial abnormality. 2. 4 mm hyperdense focus in the right cerebellum, doubtful for hemorrhage, suspect that this may represent a small calcified mass such as cavernoma or focus of old hemosiderin product. MRI could be obtained for further evaluation. 3. Asymmetric hyperdensity of the right greater than left caudate nuclei, could reflect unusual appearance of  idiopathic basal ganglial calcification, but given history, can also be seen in non ketotic hyperglycemia; MRI could also be obtained to further evaluate. 4. 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 Atrophy and moderate small vessel ischemic changes of the white matter. Electronically Signed   By: Donavan Foil M.D.   On: 01/20/2017 18:17   Ct Chest High Resolution  Result Date: 01/22/2017 CLINICAL DATA:  Inpatient. Follow-up interstitial lung disease. Insertional fatigue. Worsening hypoxemia. EXAM: CT CHEST WITHOUT CONTRAST TECHNIQUE: Multidetector CT imaging of the chest was performed following the standard protocol without intravenous contrast. High resolution imaging of the lungs, as well as inspiratory and expiratory imaging, was performed. COMPARISON:  01/20/2017 chest radiograph. 07/21/2016 high-resolution chest CT. FINDINGS: Motion degraded scan. Cardiovascular: Stable mild cardiomegaly. No significant pericardial fluid/thickening. Left main, left anterior descending, left circumflex and right coronary atherosclerosis. Atherosclerotic nonaneurysmal thoracic aorta. Normal caliber pulmonary arteries. Mediastinum/Nodes: No discrete thyroid nodules. Mildly patulous thoracic esophagus. Small fluid levels in the mid and lower thoracic esophagus. No axillary adenopathy. Stable mildly enlarged 1.0 cm right paratracheal node (series 2/ image 45). Stable enlarged 1.4 cm AP window node (series 2/ image 44). No new pathologically enlarged mediastinal nodes. Mild bilateral hilar adenopathy appears stable and is poorly delineated on these noncontrast images. Lungs/Pleura: No pneumothorax. Trace dependent right pleural effusion. No left pleural effusion. No acute consolidative airspace disease, lung masses or significant pulmonary nodules. There is patchy confluent subpleural reticulation and ground-glass attenuation with associated moderate traction bronchiectasis and architectural distortion throughout both  lungs, with a strong basilar predominance. No frank honeycombing. No convincing interval progression since 07/21/2016 CT, although with clear progression compared to the 03/05/2014 CT. There is mild air trapping at the lung apices on the expiration sequence. There is new relatively uniform mild parenchymal ground-glass attenuation and interlobular septal thickening in both lungs. Upper abdomen: Stable subcentimeter hypodense liver lesion in the anterior left liver lobe, too small to characterize, probably benign. No acute abnormality in the upper abdomen. Musculoskeletal: No aggressive appearing focal osseous lesions. Marked thoracic spondylosis. IMPRESSION: 1. No convincing interval progression of basilar predominant fibrotic interstitial lung disease since 07/21/2016 high-resolution chest CT study, although with clear progression compared to the more remote 03/05/2014 CT study. Findings are most compatible with usual interstitial pneumonia (UIP) given the strong basilar predominance and progression. 2. New mild relatively uniform parenchymal ground-glass attenuation and interlobular septal thickening in the lungs, suggestive of a component of mild pulmonary edema due to heart failure given the cardiomegaly and new trace dependent right pleural effusion. 3. Mild air trapping at the lung apices, indicative of small airways disease. 4. Stable mild mediastinal and bilateral hilar lymphadenopathy, most compatible with benign reactive  adenopathy. 5. Left main and 3 vessel coronary atherosclerosis. Aortic Atherosclerosis (ICD10-I70.0). Electronically Signed   By: Ilona Sorrel M.D.   On: 01/22/2017 12:46   Dg Foot Complete Left  Result Date: 01/20/2017 CLINICAL DATA:  Weakness, pain and swelling base of fifth metatarsal EXAM: LEFT FOOT - COMPLETE 3+ VIEW COMPARISON:  None. FINDINGS: Irregular lucency at the base of fifth metatarsal consistent with nondisplaced fracture. No definitive articular extension on the views  submitted. Moderate bunion formation and degenerative changes at the first MTP joint. Moderate plantar calcaneal spur. Vascular calcifications IMPRESSION: 1. Nondisplaced fracture at the base of the fifth metatarsal 2. Degenerative changes at the first MTP joint with moderate bunion formation at the head of the first metatarsal Electronically Signed   By: Donavan Foil M.D.   On: 01/20/2017 18:26     LOS: 1 day   Oren Binet, MD  [facility] Pager:[phone]  If 7PM-7AM, please contact night-coverage [email or web address] Password [email or web address] 01/22/2017, 1:16 PM

## 2017-01-22 NOTE — Assessment & Plan Note (Signed)
The story of exertional confusion and fatigue immediately relieved by rest although a little bit atypical is consistent with worsening hypoxemia seen in pulmonary fibrosis patient's the deterioration over the last 3 weeks suggest that this is.  IPF flare up   plan -High-resolution CT chest  -Respiratory virus panel -Pro-calcitonin protocol -Titrate oxygen to keep pulse ox greater than 88% [currently needing 4 L nasal cannula]

## 2017-01-22 NOTE — Consult Note (Signed)
PULMONARY / CRITICAL CARE MEDICINE   Name: Christine Pratt MRN: [id number] DOB: 08-16-1925 PCP Mayra Neer, MD LOS 1 as of 01/22/2017     ADMISSION DATE:  01/20/2017 CONSULTATION DATE:  01/22/2017  REFERRING MD:  ILD worsening   CHIEF COMPLAINT:  Worsening confusion, fatigue with exertion  HISTORY OF PRESENT ILLNESS:   History is obtained from the granddaughter and by review of the chart  As best as I can ascertain this patient has interstitial lung disease on his CT scans that I personally visualized dating back to 2010.  According to the granddaughter they are aware of interstitial lung disease/pulmonary fibrosis diagnosis since 2015 and have been following with Dr. Lamonte Sakai.  Since then she is also been on oxygen initially only at night but later on 24/7.  The granddaughter believes that the baseline oxygen use is around 4 L nasal cannula.  Granddaughter reports slow progressive steady decline in shortness of breath but still patient was able to ambulate and do activities of daily living without a problem.  However, in the last few to several weeks especially in the last 3 weeks has been a steady decline.  What is interesting is that the granddaughter describes exertional fatigue and confusional episodes associated with hypertension.  The symptoms are immediately relieved by rest.  This happened just walking short distances from the bed to the bathroom.  The granddaughter is not sure if it is associated with worsening dyspnea but she believes that the be the case.  At home they did not check for correlation of this and exertional hypoxemia.  Granddaughter denies any fever chills cough sputum or worsening pedal edema or chest pain.  Upon admission on January 20, 2017 CT head was negative.  In terms of her interstitial lung disease her CT pattern most recently high-resolution CT in May 2018 is 1 of classic UIP with bilateral bibasilar subpleural disease with basal predominance and symmetric and  honeycombing.  Features have definitely progressed between 2015 and 2018.  In 2015 her autoimmune profile was negative.  In terms of exposure history she denies any birds or mold or mildew.  However she does live in a house built in the 1930s and she might have feathered pillows.  She is not on any anti-fibrotic pulmonary regimen   PAST MEDICAL HISTORY :  She  has a past medical history of CAD S/P percutaneous coronary angioplasty (01/16/2014), CKD (chronic kidney disease), stage III ([place]), Claudication ([place]), Depression, DJD (degenerative joint disease), Essential hypertension, Osteoarthritis, Pulmonary fibrosis ([place]), Pulmonary nodule, and Spinal stenosis.  PAST SURGICAL HISTORY: She  has a past surgical history that includes Tubal ligation; Appendectomy; Back surgery; transthoracic echocardiogram (12/06/2013 ); Percutaneous coronary stent intervention (pci-s) (01/16/2014); NM MYOVIEW LTD (04/2016); and LEFT AND RIGHT HEART CATHETERIZATION WITH CORONARY ANGIOGRAM (N/A, 01/16/2014).  No Known Allergies  No current facility-administered medications on file prior to encounter.    Current Outpatient Medications on File Prior to Encounter  Medication Sig  . albuterol (PROVENTIL) (2.5 MG/3ML) 0.083% nebulizer solution Take 2.5 mg by nebulization every 6 (six) hours as needed.   Marland Kitchen albuterol (VENTOLIN HFA) 108 (90 Base) MCG/ACT inhaler Inhale 2 puffs daily as needed into the lungs for wheezing or shortness of breath.  Marland Kitchen amLODipine (NORVASC) 5 MG tablet take 1 tablet by mouth once daily  . atorvastatin (LIPITOR) 20 MG tablet Take 0.5 tablets (10 mg total) by mouth daily.  . Cyanocobalamin (VITAMIN B 12 PO) Take 1 tablet daily by mouth.  . escitalopram (  LEXAPRO) 20 MG tablet Take 20 mg by mouth daily.   . furosemide (LASIX) 20 MG tablet Take 20 mg by mouth daily.   Marland Kitchen LORazepam (ATIVAN) 1 MG tablet Take 1 mg by mouth daily.   . nitroGLYCERIN (NITROSTAT) 0.4 MG SL tablet Place 1 tablet (0.4 mg total)  under the tongue every 5 (five) minutes as needed for chest pain.  . Oxycodone HCl 20 MG TABS Take 20 mg by mouth 3 (three) times daily.   . Polyethylene Glycol 3350 (MIRALAX PO) Take 17 g by mouth daily as needed (for constipation).   . quinapril (ACCUPRIL) 20 MG tablet Take 1 tablet by mouth daily.    FAMILY HISTORY:  Her indicated that her mother is deceased. She indicated that her father is deceased. She indicated that all of her five sisters are deceased. She indicated that two of her seven brothers are alive. She indicated that both of her daughters are alive. She indicated that her son is alive.   SOCIAL HISTORY: She  reports that  has never smoked. she has never used smokeless tobacco. She reports that she does not drink alcohol or use drugs.  REVIEW OF SYSTEMS:   As per history of present illness   VITAL SIGNS: BP (!) 161/82 (BP Location: Left Arm)   Pulse 68   Temp 97.7 F (36.5 C) (Oral)   Resp 18   Ht _0  (1.575 m)   Wt 70.3 kg (155 lb)   SpO2 96%   BMI 28.35 kg/m   HEMODYNAMICS:    VENTILATOR SETTINGS:    INTAKE / OUTPUT: I/O last 3 completed shifts: In: 1090 [P.O.:240; IV Piggyback:850] Out: -      EXAM  General Appearance:    Looks chronically unwell  Head:    Normocephalic, without obvious abnormality, atraumatic  Eyes:    PERRL -yes, conjunctiva/corneas -clear      Ears:    Normal external ear canals, both ears  Nose:   NG tube -no but she is on 4 L oxygen  Throat:  ETT TUBE -no, OG tube -no  Neck:   Supple,  No enlargement/tenderness/nodules     Lungs:     Clear to auscultation bilaterally without any respiratory distress but at the lung base she does have bilateral bibasilar crackles  Chest wall:    No deformity  Heart:    S1 and S2 normal, no murmur, CVP -no.  Pressors -no  Abdomen:     Soft, no masses, no organomegaly  Genitalia:    Not done  Rectal:   not done  Extremities:   Extremities-no edema no cyanosis no clubbing     Skin:    Intact in exposed areas .     Neurologic:   Sedation -none-> RASS -0. Moves all 4s -yes. CAM-ICU -negative for delirium. Orientation -x3+ but hard of hearing       LABS  PULMONARY No results for input(s): PHART, PCO2ART, PO2ART, HCO3, TCO2, O2SAT in the last 168 hours.  Invalid input(s): PCO2, PO2  CBC Recent Labs  Lab 01/20/17 1502 01/21/17 0127 01/22/17 0753  HGB 12.4 11.1* 11.9*  HCT 38.1 33.9* 36.7  WBC 8.9 7.6 8.2  PLT 257 226 238    COAGULATION No results for input(s): INR in the last 168 hours.  CARDIAC   Recent Labs  Lab 01/21/17 0127 01/21/17 0724 01/21/17 1340  TROPONINI 0.10* 0.09* 0.07*   No results for input(s): PROBNP in the last 168 hours.   CHEMISTRY  Recent Labs  Lab 01/20/17 1502 01/21/17 0127 01/22/17 0753  NA 140 138 139  K 4.0 3.5 4.1  CL 102 103 103  CO2 _0 GLUCOSE 181* 185* 143*  BUN 24* 20 21*  CREATININE 1.34* 1.11* 1.24*  CALCIUM 8.6* 8.2* 8.8*   Estimated Creatinine Clearance: 28.3 mL/min (A) (by C-G formula based on SCr of 1.24 mg/dL (H)).   LIVER No results for input(s): AST, ALT, ALKPHOS, BILITOT, PROT, ALBUMIN, INR in the last 168 hours.   INFECTIOUS No results for input(s): LATICACIDVEN, PROCALCITON in the last 168 hours.   ENDOCRINE CBG (last 3)  Recent Labs    01/21/17 1657 01/21/17 2144 01/22/17 0748  GLUCAP 128* 150* 132*         IMAGING x48h  - image(s) personally visualized  -   highlighted in bold Dg Chest 2 View  Result Date: 01/20/2017 CLINICAL DATA:  Chest pain and dyspnea x3 days. EXAM: CHEST  2 VIEW COMPARISON:  07/21/2016 chest CT, CXR 05/17/2013 FINDINGS: The cardiopericardial silhouette is top-normal in size with mild-to-moderate atherosclerosis of the aortic arch. No aneurysm is noted. Diffuse interstitial prominence of lung markings with basilar predominance is again noted consistent with changes of interstitial fibrosis. Superimposed areas of atelectasis and mild central  vascular congestion are identified. Subtle subpleural airspace opacity in the anterior right middle lobe is suggested some which is likely related to atelectasis and/or fibrosis. A mild superimposed pneumonia is not excluded. No pneumothorax is seen. Chronic degenerative change of the thoracic spine. IMPRESSION: Aortic atherosclerosis with chronic prominence of interstitial lung markings consistent with fibrosis. Atelectasis and superimposed subtle pneumonia in the right middle lobe distribution is suggested on today's exam. Electronically Signed   By: Ashley Royalty M.D.   On: 01/20/2017 18:29   Ct Head Wo Contrast  Result Date: 01/20/2017 CLINICAL DATA:  Altered level of consciousness EXAM: CT HEAD WITHOUT CONTRAST TECHNIQUE: Contiguous axial images were obtained from the base of the skull through the vertex without intravenous contrast. COMPARISON:  None. FINDINGS: Brain: No large territorial infarction or mass is visualized. No definitive acute hemorrhage is seen. Focal hyperdensity within the right cerebellum measuring 4 mm. No significant mass effect or surrounding edema. Mild basal ganglial calcification on the right. Subtle asymmetric hyperdensity of the right caudate. Old encephalomalacia involving the right temporal and occipital lobes. Moderate atrophy. Moderate small vessel ischemic changes of the white matter. Prominent ventricles likely due to atrophy. Vascular: No hyperdense vessels.  Carotid artery calcification. Skull: No fracture or suspicious bone lesion. Sinuses/Orbits: No acute finding. Other: None IMPRESSION: 1. No definite CT evidence for acute intracranial abnormality. 2. 4 mm hyperdense focus in the right cerebellum, doubtful for hemorrhage, suspect that this may represent a small calcified mass such as cavernoma or focus of old hemosiderin product. MRI could be obtained for further evaluation. 3. Asymmetric hyperdensity of the right greater than left caudate nuclei, could reflect unusual  appearance of idiopathic basal ganglial calcification, but given history, can also be seen in non ketotic hyperglycemia; MRI could also be obtained to further evaluate. 4. 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 Atrophy and moderate small vessel ischemic changes of the white matter. Electronically Signed   By: Donavan Foil M.D.   On: 01/20/2017 18:17   Dg Foot Complete Left  Result Date: 01/20/2017 CLINICAL DATA:  Weakness, pain and swelling base of fifth metatarsal EXAM: LEFT FOOT - COMPLETE 3+ VIEW COMPARISON:  None. FINDINGS: Irregular lucency at the base of  fifth metatarsal consistent with nondisplaced fracture. No definitive articular extension on the views submitted. Moderate bunion formation and degenerative changes at the first MTP joint. Moderate plantar calcaneal spur. Vascular calcifications IMPRESSION: 1. Nondisplaced fracture at the base of the fifth metatarsal 2. Degenerative changes at the first MTP joint with moderate bunion formation at the head of the first metatarsal Electronically Signed   By: Donavan Foil M.D.   On: 01/20/2017 18:26       ASSESSMENT and PLAN  Acute on chronic respiratory failure with hypoxemia (HCC) The story of exertional confusion and fatigue immediately relieved by rest although a little bit atypical is consistent with worsening hypoxemia seen in pulmonary fibrosis patient's the deterioration over the last 3 weeks suggest that this is.  IPF flare up   plan -High-resolution CT chest  -Respiratory virus panel -Pro-calcitonin protocol -Titrate oxygen to keep pulse ox greater than 88% [currently needing 4 L nasal cannula]    IPF (idiopathic pulmonary fibrosis) ([place]) I believe the primary diagnosis is idiopathic pulmonary fibrosis.  This is based on classic UIP pattern on CT scan, progression, age greater than 80 and negative autoimmune profile.  The only differential diagnosis would be chronic hypersensitivity pneumonitis which can mimic this  because of the history of she having feathered pillows but I believe the possibility of this is low  Currently it is likely that she has IPF flareup  Plan -Get high-resolution CT as described above -Clearly if her fibrosis is getting worse and she has IPF flareup suggestion on the CT scan then we can do 3 weeks of steroids -Also the suggestion of IPF flare up on the CT scan, it is to be noted that the 41-monthmortality after an admission for  IPF flareup is extremely high and therefore palliative support would be appropriate         FAMILY  - Updates: 01/22/2017 --> updated granddaughter at the bedside  - Inter-disciplinary family meet or Palliative Care meeting due by:  DAy 7. Current LOS is LOS 1 days  CODE STATUS    Code Status Orders  (From admission, onward)        Start     Ordered   01/20/17 2146  Full code  Continuous     01/20/17 2146    Code Status History    Date Active Date Inactive Code Status Order ID Comments User Context   01/16/2014 18:42 01/17/2014 15:58 Full Code [id number] HLeonie Man MD Inpatient    Advance Directive Documentation     Most Recent Value  Type of Advance Directive  Healthcare Power of Attorney  Pre-existing out of facility DNR order (yellow form or pink MOST form)  No data  "MOST" Form in Place?  No data        DISPO Keep in medical floor    Dr. MBrand Males M.D., FPalm Beach Gardens Medical CenterC.P Pulmonary and Critical Care Medicine Staff Physician CWillisvillePulmonary and Critical Care Pager: [phone] If no answer or between  15:00h - 7:00h: call [phone]  01/22/2017 9:42 AM

## 2017-01-22 NOTE — Assessment & Plan Note (Signed)
I believe the primary diagnosis is idiopathic pulmonary fibrosis.  This is based on classic UIP pattern on CT scan, progression, age greater than 68 and negative autoimmune profile.  The only differential diagnosis would be chronic hypersensitivity pneumonitis which can mimic this because of the history of she having feathered pillows but I believe the possibility of this is low  Currently it is likely that she has IPF flareup  Plan -Get high-resolution CT as described above -Clearly if her fibrosis is getting worse and she has IPF flareup suggestion on the CT scan then we can do 3 weeks of steroids -Also the suggestion of IPF flare up on the CT scan, it is to be noted that the 69-monthmortality after an admission for  IPF flareup is extremely high and therefore palliative support would be appropriate

## 2017-01-22 NOTE — Progress Notes (Signed)
Daughter & granddaughter had concerns about patient taking Lipitor. They stated that they told ER staff that they did not want her taking this medication. Med remains on current order list.

## 2017-01-22 NOTE — Evaluation (Signed)
Physical Therapy Evaluation Patient Details Name: Christine Pratt MRN: [id number] DOB: 1925-11-01 Today's Date: 01/22/2017   History of Present Illness  83 yo female admitted with Pna. Hx of pulmonary fibrosis, Dm, L 5th met fx-post op shoe, CAD, CKD, spinal stenosis.   Clinical Impression  On eval, pt required min assist for mobility. She walked ~15'x2 with a RW. Pt presents with general weakness, decreased activity tolerance, and impaired gait and balance. She fatigues easily with minimal activity. Remained on [place] O2 during session (end of session sat reading 93%). D/c plan is for home. Will follow and progress activity as able/tolerated.     Follow Up Recommendations Supervision/Assistance - 24 hour; home with hospice (per chart)    Equipment Recommendations  3in1; transport chair (if pt/family decide they want dme)   Recommendations for Other Services       Precautions / Restrictions Precautions Precautions: Fall Precaution Comments: O2 dep Restrictions Weight Bearing Restrictions: No      Mobility  Bed Mobility Overal bed mobility: Needs Assistance Bed Mobility: Supine to Sit;Sit to Supine     Supine to sit: HOB elevated;Min guard Sit to supine: Min assist;HOB elevated   General bed mobility comments: Assist for LEs back onto bed.   Transfers Overall transfer level: Needs assistance Equipment used: Rolling walker (2 wheeled) Transfers: Sit to/from Stand Sit to Stand: Min assist         General transfer comment: Assist to rise, stabilize, control descent. VCs safety, hand placement.   Ambulation/Gait Ambulation/Gait assistance: Min assist Ambulation Distance (Feet): 15 Feet(x2) Assistive device: Rolling walker (2 wheeled) Gait Pattern/deviations: Step-through pattern;Decreased stride length     General Gait Details: slow gait speed. Assist to steady intermittently. Remained on [place] O2. Dyspnea 3/4. Pt fatigues easily.   Stairs            Wheelchair  Mobility    Modified Rankin (Stroke Patients Only)       Balance Overall balance assessment: Needs assistance         Standing balance support: Bilateral upper extremity supported Standing balance-Leahy Scale: Poor                               Pertinent Vitals/Pain Pain Assessment: No/denies pain    Home Living Family/patient expects to be discharged to:: Private residence Living Arrangements: Children Available Help at Discharge: Family           Home Equipment: Gilford Rile - 2 wheels;Wheelchair - manual Additional Comments: wheelchair unable to be used in home due to size    Prior Function Level of Independence: Independent               Hand Dominance        Extremity/Trunk Assessment   Upper Extremity Assessment Upper Extremity Assessment: Generalized weakness    Lower Extremity Assessment Lower Extremity Assessment: Generalized weakness    Cervical / Trunk Assessment Cervical / Trunk Assessment: Kyphotic  Communication   Communication: No difficulties  Cognition Arousal/Alertness: Awake/alert Behavior During Therapy: WFL for tasks assessed/performed Overall Cognitive Status: Within Functional Limits for tasks assessed                                        General Comments      Exercises     Assessment/Plan    PT Assessment  PT Problem List         PT Treatment Interventions      PT Goals (Current goals can be found in the Care Plan section)  Acute Rehab PT Goals Patient Stated Goal: home PT Goal Formulation: With patient/family Time For Goal Achievement: 02/05/17 Potential to Achieve Goals: Fair    Frequency     Barriers to discharge        Co-evaluation               AM-PAC PT "6 Clicks" Daily Activity  Outcome Measure Difficulty turning over in bed (including adjusting bedclothes, sheets and blankets)?: A Little Difficulty moving from lying on back to sitting on the side of the  bed? : Unable Difficulty sitting down on and standing up from a chair with arms (e.g., wheelchair, bedside commode, etc,.)?: Unable Help needed moving to and from a bed to chair (including a wheelchair)?: A Little Help needed walking in hospital room?: A Little Help needed climbing 3-5 steps with a railing? : A Lot 6 Click Score: 13    End of Session Equipment Utilized During Treatment: Oxygen Activity Tolerance: Patient limited by fatigue Patient left: in bed;with call bell/phone within reach;with family/visitor present   PT Visit Diagnosis: Muscle weakness (generalized) (M62.81);Difficulty in walking, not elsewhere classified (R26.2)    Time: 9359-4090 PT Time Calculation (min) (ACUTE ONLY): 19 min   Charges:   PT Evaluation $PT Eval Moderate Complexity: 1 Mod     PT G Codes:          Weston Anna, MPT Pager: [phone]

## 2017-01-22 NOTE — Progress Notes (Signed)
Pt unable to participate in [place] mtg. Currently sedated. No family at bedside. Called Ms. Lorri Frederick at [phone]. LM. PMT will continue to stay involved and attempt to arrange [place], ANP

## 2017-01-23 LAB — HEPATIC FUNCTION PANEL
ALBUMIN: 3.4 g/dL — AB (ref 3.5–5.0)
ALK PHOS: 57 U/L (ref 38–126)
ALT: 33 U/L (ref 14–54)
AST: 19 U/L (ref 15–41)
BILIRUBIN DIRECT: 0.1 mg/dL (ref 0.1–0.5)
BILIRUBIN TOTAL: 0.6 mg/dL (ref 0.3–1.2)
Indirect Bilirubin: 0.5 mg/dL (ref 0.3–0.9)
Total Protein: 6.6 g/dL (ref 6.5–8.1)

## 2017-01-23 LAB — BASIC METABOLIC PANEL
ANION GAP: 8 (ref 5–15)
BUN: 21 mg/dL — ABNORMAL HIGH (ref 6–20)
CALCIUM: 8.7 mg/dL — AB (ref 8.9–10.3)
CHLORIDE: 102 mmol/L (ref 101–111)
CO2: 29 mmol/L (ref 22–32)
Creatinine, Ser: 1.35 mg/dL — ABNORMAL HIGH (ref 0.44–1.00)
GFR calc Af Amer: 39 mL/min — ABNORMAL LOW (ref >60)
GFR calc non Af Amer: 34 mL/min — ABNORMAL LOW (ref >60)
GLUCOSE: 125 mg/dL — AB (ref 65–99)
POTASSIUM: 4.6 mmol/L (ref 3.5–5.1)
Sodium: 139 mmol/L (ref 135–145)

## 2017-01-23 LAB — GLUCOSE, CAPILLARY
Glucose-Capillary: 129 mg/dL — ABNORMAL HIGH (ref 65–99)
Glucose-Capillary: 175 mg/dL — ABNORMAL HIGH (ref 65–99)
Glucose-Capillary: 176 mg/dL — ABNORMAL HIGH (ref 65–99)

## 2017-01-23 LAB — CBC
HEMATOCRIT: 37 % (ref 36.0–46.0)
HEMOGLOBIN: 11.9 g/dL — AB (ref 12.0–15.0)
MCH: 30.1 pg (ref 26.0–34.0)
MCHC: 32.2 g/dL (ref 30.0–36.0)
MCV: 93.7 fL (ref 78.0–100.0)
Platelets: 245 10*3/uL (ref 150–400)
RBC: 3.95 MIL/uL (ref 3.87–5.11)
RDW: 15.2 % (ref 11.5–15.5)
WBC: 7.6 10*3/uL (ref 4.0–10.5)

## 2017-01-23 LAB — BRAIN NATRIURETIC PEPTIDE: B Natriuretic Peptide: 1072.2 pg/mL — ABNORMAL HIGH (ref 0.0–100.0)

## 2017-01-23 LAB — LEGIONELLA PNEUMOPHILA SEROGP 1 UR AG: L. pneumophila Serogp 1 Ur Ag: NEGATIVE

## 2017-01-23 MED ORDER — PANTOPRAZOLE SODIUM 40 MG PO TBEC
40.0000 mg | DELAYED_RELEASE_TABLET | Freq: Every day | ORAL | Status: DC
  Administered 2017-01-24 – 2017-01-25 (×2): 40 mg via ORAL
  Filled 2017-01-23 (×2): qty 1

## 2017-01-23 MED ORDER — FUROSEMIDE 10 MG/ML IJ SOLN
40.0000 mg | Freq: Once | INTRAMUSCULAR | Status: AC
  Administered 2017-01-23: 40 mg via INTRAVENOUS
  Filled 2017-01-23: qty 4

## 2017-01-23 MED ORDER — METHYLPREDNISOLONE SODIUM SUCC 125 MG IJ SOLR
80.0000 mg | Freq: Four times a day (QID) | INTRAMUSCULAR | Status: DC
  Administered 2017-01-23 (×3): 80 mg via INTRAVENOUS
  Administered 2017-01-24: 62.5 mg via INTRAVENOUS
  Administered 2017-01-24: 80 mg via INTRAVENOUS
  Filled 2017-01-23 (×5): qty 2

## 2017-01-23 NOTE — Assessment & Plan Note (Addendum)
Features quite c/w IPF flare with GGO on CT . No evidence of infection - PCT negative, RVP negative, Afebrile, WBC normal, . There is question of UTI but hx not wc/w UTI,  WBC/RBc within normal limits on UA. Culture pending v not sent  Plan IV sterpids x since 11/10 through 11/12 and then po steroids  2-3 weeks - d/w family Reduce IV steroid dose 01/24/2017 due to hyoperglycemia

## 2017-01-23 NOTE — Assessment & Plan Note (Addendum)
This is IPF.  Lonng disucssion with multiple family members (son, daughters x 2, d-il, and grandaughter x 2). On 01/23/17  Plan 4L [place] No anti-fibrotics Agree with hospce at home but still survival expected to be few to several month During this time, family counseled for aptient to be as active as she can Will test her pulse ox on stting at 4L [place] and walking in hallway 01/24/2017

## 2017-01-23 NOTE — Consult Note (Signed)
PULMONARY / CRITICAL CARE MEDICINE   Name: Christine Pratt MRN: [id number] DOB: 1925-09-04 PCP Mayra Neer, MD LOS 2 as of 01/23/2017     ADMISSION DATE:  01/20/2017 CONSULTATION DATE:  01/23/2017  REFERRING MD:  ILD worsening   CHIEF COMPLAINT:  Worsening confusion, fatigue with exertion brief  History is obtained from the granddaughter and by review of the chart  As best as I can ascertain this patient has interstitial lung disease on his CT scans that I personally visualized dating back to 2010.  According to the granddaughter they are aware of interstitial lung disease/pulmonary fibrosis diagnosis since 2015 and have been following with Dr. Lamonte Sakai.  Since then she is also been on oxygen initially only at night but later on 24/7.  The granddaughter believes that the baseline oxygen use is around 4 L nasal cannula.  Granddaughter reports slow progressive steady decline in shortness of breath but still patient was able to ambulate and do activities of daily living without a problem.  However, in the last few to several weeks especially in the last 3 weeks has been a steady decline.  What is interesting is that the granddaughter describes exertional fatigue and confusional episodes associated with hypertension.  The symptoms are immediately relieved by rest.  This happened just walking short distances from the bed to the bathroom.  The granddaughter is not sure if it is associated with worsening dyspnea but she believes that the be the case.  At home they did not check for correlation of this and exertional hypoxemia.  Granddaughter denies any fever chills cough sputum or worsening pedal edema or chest pain.  Upon admission on January 20, 2017 CT head was negative.  In terms of her interstitial lung disease her CT pattern most recently high-resolution CT in May 2018 is 1 of classic UIP with bilateral bibasilar subpleural disease with basal predominance and symmetric and honeycombing.  Features  have definitely progressed between 2015 and 2018.  In 2015 her autoimmune profile was negative.  In terms of exposure history she denies any birds or mold or mildew.  However she does live in a house built in the 1930s and she might have feathered pillows.  She is not on any anti-fibrotic pulmonary regimen     SUBJECTIVE/OVERNIGHT/INTERVAL HX - ECHO 11/8 - RV failure with PASP 90s. . CT 11/9 - UIP with mild ggo and mild/small pleural effusion per radiology. Per my viscualiatio - UIP is same . GGO quite prominent esp in UL but agree with air trapping. Not sue of effusions. RVP negative. Urine strep negative. And PCT < 0.1  VITAL SIGNS: BP (!) 142/69 (BP Location: Right Arm)   Pulse 71   Temp 97.9 F (36.6 C) (Oral)   Resp 18   Ht _0  (1.575 m)   Wt 70.3 kg (155 lb)   SpO2 100%   BMI 28.35 kg/m   HEMODYNAMICS:    VENTILATOR SETTINGS:    INTAKE / OUTPUT: No intake/output data recorded.     EXAM  General Appearance:    Looks chronically unwell  Head:    Normocephalic, without obvious abnormality, atraumatic  Eyes:    PERRL -yes, conjunctiva/corneas -clear      Ears:    Normal external ear canals, both ears  Nose:   NG tube -no but she is on 4 L oxygen  Throat:  ETT TUBE -no, OG tube -no  Neck:   Supple,  No enlargement/tenderness/nodules     Lungs:  Clear to auscultation bilaterally without any respiratory distress but at the lung base she does have bilateral bibasilar crackles  Chest wall:    No deformity  Heart:    S1 and S2 normal, no murmur, CVP -no.  Pressors -no  Abdomen:     Soft, no masses, no organomegaly  Genitalia:    Not done  Rectal:   not done  Extremities:   Extremities-no edema no cyanosis no clubbing     Skin:   Intact in exposed areas .     Neurologic:   Sedation -none-> RASS -0. Moves all 4s -yes. CAM-ICU -negative for delirium. Orientation -x3+ but hard of hearing       LABS  PULMONARY No results for input(s): PHART, PCO2ART, PO2ART,  HCO3, TCO2, O2SAT in the last 168 hours.  Invalid input(s): PCO2, PO2  CBC Recent Labs  Lab 01/21/17 0127 01/22/17 0753 01/23/17 0633  HGB 11.1* 11.9* 11.9*  HCT 33.9* 36.7 37.0  WBC 7.6 8.2 7.6  PLT 226 238 245    COAGULATION No results for input(s): INR in the last 168 hours.  CARDIAC   Recent Labs  Lab 01/21/17 0127 01/21/17 0724 01/21/17 1340  TROPONINI 0.10* 0.09* 0.07*   No results for input(s): PROBNP in the last 168 hours.   CHEMISTRY Recent Labs  Lab 01/20/17 1502 01/21/17 0127 01/22/17 0753 01/23/17 0633  NA 140 138 139 139  K 4.0 3.5 4.1 4.6  CL 102 103 103 102  CO2 _0 GLUCOSE 181* 185* 143* 125*  BUN 24* 20 21* 21*  CREATININE 1.34* 1.11* 1.24* 1.35*  CALCIUM 8.6* 8.2* 8.8* 8.7*   Estimated Creatinine Clearance: 26 mL/min (A) (by C-G formula based on SCr of 1.35 mg/dL (H)).   LIVER Recent Labs  Lab 01/23/17 0633  AST 19  ALT 33  ALKPHOS 57  BILITOT 0.6  PROT 6.6  ALBUMIN 3.4*     INFECTIOUS Recent Labs  Lab 01/22/17 1012  PROCALCITON <0.10     ENDOCRINE CBG (last 3)  Recent Labs    01/22/17 1200 01/22/17 2153 01/23/17 0720  GLUCAP 137* 144* 129*         IMAGING x48h  - image(s) personally visualized  -   highlighted in bold Ct Chest High Resolution  Result Date: 01/22/2017 CLINICAL DATA:  Inpatient. Follow-up interstitial lung disease. Insertional fatigue. Worsening hypoxemia. EXAM: CT CHEST WITHOUT CONTRAST TECHNIQUE: Multidetector CT imaging of the chest was performed following the standard protocol without intravenous contrast. High resolution imaging of the lungs, as well as inspiratory and expiratory imaging, was performed. COMPARISON:  01/20/2017 chest radiograph. 07/21/2016 high-resolution chest CT. FINDINGS: Motion degraded scan. Cardiovascular: Stable mild cardiomegaly. No significant pericardial fluid/thickening. Left main, left anterior descending, left circumflex and right coronary  atherosclerosis. Atherosclerotic nonaneurysmal thoracic aorta. Normal caliber pulmonary arteries. Mediastinum/Nodes: No discrete thyroid nodules. Mildly patulous thoracic esophagus. Small fluid levels in the mid and lower thoracic esophagus. No axillary adenopathy. Stable mildly enlarged 1.0 cm right paratracheal node (series 2/ image 45). Stable enlarged 1.4 cm AP window node (series 2/ image 44). No new pathologically enlarged mediastinal nodes. Mild bilateral hilar adenopathy appears stable and is poorly delineated on these noncontrast images. Lungs/Pleura: No pneumothorax. Trace dependent right pleural effusion. No left pleural effusion. No acute consolidative airspace disease, lung masses or significant pulmonary nodules. There is patchy confluent subpleural reticulation and ground-glass attenuation with associated moderate traction bronchiectasis and architectural distortion throughout both lungs, with a strong basilar predominance.  No frank honeycombing. No convincing interval progression since 07/21/2016 CT, although with clear progression compared to the 03/05/2014 CT. There is mild air trapping at the lung apices on the expiration sequence. There is new relatively uniform mild parenchymal ground-glass attenuation and interlobular septal thickening in both lungs. Upper abdomen: Stable subcentimeter hypodense liver lesion in the anterior left liver lobe, too small to characterize, probably benign. No acute abnormality in the upper abdomen. Musculoskeletal: No aggressive appearing focal osseous lesions. Marked thoracic spondylosis. IMPRESSION: 1. No convincing interval progression of basilar predominant fibrotic interstitial lung disease since 07/21/2016 high-resolution chest CT study, although with clear progression compared to the more remote 03/05/2014 CT study. Findings are most compatible with usual interstitial pneumonia (UIP) given the strong basilar predominance and progression. 2. New mild relatively  uniform parenchymal ground-glass attenuation and interlobular septal thickening in the lungs, suggestive of a component of mild pulmonary edema due to heart failure given the cardiomegaly and new trace dependent right pleural effusion. 3. Mild air trapping at the lung apices, indicative of small airways disease. 4. Stable mild mediastinal and bilateral hilar lymphadenopathy, most compatible with benign reactive adenopathy. 5. Left main and 3 vessel coronary atherosclerosis. Aortic Atherosclerosis (ICD10-I70.0). Electronically Signed   By: Ilona Sorrel M.D.   On: 01/22/2017 12:46       ASSESSMENT and PLAN  Acute on chronic respiratory failure with hypoxemia (HCC) The story of exertional confusion and fatigue immediately relieved by rest although a little bit atypical is consistent with worsening hypoxemia seen in pulmonary fibrosis patient's the deterioration over the last 3 weeks suggest that this is.  IPF flare up   plan -High-resolution CT chest  -Respiratory virus panel -Pro-calcitonin protocol -Titrate oxygen to keep pulse ox greater than 88% [currently needing 4 L nasal cannula]    IPF (idiopathic pulmonary fibrosis) ([place]) I believe the primary diagnosis is idiopathic pulmonary fibrosis.  This is based on classic UIP pattern on CT scan, progression, age greater than 31 and negative autoimmune profile.  The only differential diagnosis would be chronic hypersensitivity pneumonitis which can mimic this because of the history of she having feathered pillows but I believe the possibility of this is low  Currently it is likely that she has IPF flareup  Plan -Get high-resolution CT as described above -Clearly if her fibrosis is getting worse and she has IPF flareup suggestion on the CT scan then we can do 3 weeks of steroids -Also the suggestion of IPF flare up on the CT scan, it is to be noted that the 49-monthmortality after an admission for  IPF flareup is extremely high and therefore  palliative support would be appropriate     Acute on chronic respiratory failure with hypoxemia ([place] Features quite c/w IPF flare with GGO on CT . No evidence of infection - PCT negative, RVP negative, Afebrile, WBC normal, . There is question of UTI but hx not wc/w UTI,  WBC/RBc within normal limits on UA. Culture pending v not sent  Plan Dc azithro Recommend dc ceftriaone by [facility] if they feel UTI less likely IV sterpids x 1-2 days then po x 2-3 weeks - d/w family   IPF (idiopathic pulmonary fibrosis) ([place] This is IPF.  Lonng disucssion with multiple family members (son, daughters x 2, d-il, and grandaughter x 2). Discussed the following   Re IPF - Course   - progressive disease in almost all patiens; she has progressed slowly in past few years   -unpredictable in each individual  -  flare is a marker of poor prognosis and will mark accelerated decline next  Year  - Rx:  anti-fibrotics + since 2014 but she is not a good candidate  - Other pillars of management  - Symptoms - cough and dyspnea RX;; appreciate pallaitive care  - O2 - definitely helps symptoms; titrate for pulse ox > 85%  - Rehab - not a candiate  - Transplant - not a candidate  - Pulmonary Trials not a candidate  - Patient Support Group    - [email or web address]; to be addressed later     > 50% of this > 40 min visit spent in face to face counseling or/and coordination of care        FAMILY  - Updates: 01/23/2017 --> updated granddaughter at the bedside  - Inter-disciplinary family meet or Palliative Care meeting due by:  DAy 7. Current LOS is LOS 2 days  CODE STATUS    Code Status Orders  (From admission, onward)        Start     Ordered   01/20/17 2146  Full code  Continuous     01/20/17 2146    Code Status History    Date Active Date Inactive Code Status Order ID Comments User Context   01/16/2014 18:42 01/17/2014 15:58 Full Code [id number]  Leonie Man, MD Inpatient     Advance Directive Documentation     Most Recent Value  Type of Advance Directive  Healthcare Power of Attorney  Pre-existing out of facility DNR order (yellow form or pink MOST form)  No data  "MOST" Form in Place?  No data        DISPO Keep in medical floor    Dr. Brand Males, M.D., [facility].C.P Pulmonary and Critical Care Medicine Staff Physician [place] [facility] Pager: [phone], If no answer or between  15:00h - 7:00h: call [phone]  01/23/2017 10:29 AM

## 2017-01-23 NOTE — Progress Notes (Addendum)
PROGRESS NOTE    Christine Pratt  ASU:[id number] DOB: [birth year] DOA: 01/20/2017 PCP: Mayra Neer, MD   Brief Narrative: 83 y.o. female with known history of pulmonary fibrosis, chronic hypoxemic respiratory failure on home O2, CAD status post PCI, brought to the hospital by family for evaluation of worsening shortness of breath, fatigue. Thought to have pneumonia on chest x-ray and admitted to the hospitalist service. Echocardiogram showed severe pulmonary hypertension-after discussion with pulmonology, palliative care consulted.  Assessment & Plan:   #Acute on chronic respiratory failure with hypoxia due to flare of interstitial lung disease.  CT scan of the chest reviewed which consistent with possible pulmonary edema.  Patient with elevated BNP.  Ordered a dose of IV Lasix.  Starting IV Solu-Medrol by pulmonologist.  Discontinue antibiotics. -Palliative care appreciated. -Pro-calcitonin negative.  RSV negative.  Patient is afebrile.  Discontinue antibiotics. -On 4 L of oxygen which his home dose.  #Interstitial pulmonary fibrosis flare alone on IV Solu-Medrol.  Pulmonary consult appreciated.  Continue bronchodilators and rescue therapy.  #Asymptomatic bacteriuria: Does not have urinary symptoms.  Discontinue IV subtraction.  #Severe pulmonary hypertension: Poor prognosis.  Palliative care following.  #History of coronary artery disease: No chest pain.  Continue current treatment.  #Essential hypertension: Continue amlodipine and lisinopril.  Monitor blood pressure.  #Type 2 diabetes: Blood sugar level acceptable.  Continue sliding scale.  #Chronic pain management: On oxycodone orally.  Continue.  #Chronic kidney disease stage III: Serum creatinine level around baseline.  Monitor BMP.   Family said patient does not take Lipitor at home and want to discontinue from the patient's medications list.  It was removed.  DVT prophylaxis: Code Status: DNR Family Communication:  Discussed with the patient's daughter, son and daughter-in-law at bedside Disposition Plan: Admitted    Consultants:   Pulmonologist  Procedures: CT scan Antimicrobials: Azithromycin and ceftriaxone on admission  Subjective: Seen and examined at bedside.  Reported shortness of breath is better.  Has dry cough.  Requiring 4 L of oxygen.  Family at bedside.  Objective: Vitals:   01/22/17 1026 01/22/17 1400 01/22/17 2150 01/23/17 0502  BP: (!) 149/82 121/60 (!) 144/82 (!) 142/69  Pulse:  77 71 71  Resp:  _0 Temp:  98.3 F (36.8 C) 97.9 F (36.6 C) 97.9 F (36.6 C)  TempSrc:  Oral Oral Oral  SpO2:  96% 97% 100%  Weight:      Height:       No intake or output data in the 24 hours ending 01/23/17 1121 Filed Weights   01/20/17 2352  Weight: 70.3 kg (155 lb)    Examination:  General exam: Appears calm and comfortable  Respiratory system: Bilateral diffuse crackles and wheezes Cardiovascular system: S1 & S2 heard, RRR.  No pedal edema. Gastrointestinal system: Abdomen is nondistended, soft and nontender. Normal bowel sounds heard. Central nervous system: Alert and oriented. No focal neurological deficits. Skin: No rashes, lesions or ulcers Psychiatry: Judgement and insight appear normal. Mood & affect appropriate.     Data Reviewed: I have personally reviewed following labs and imaging studies  CBC: Recent Labs  Lab 01/20/17 1502 01/21/17 0127 01/22/17 0753 01/23/17 0633  WBC 8.9 7.6 8.2 7.6  HGB 12.4 11.1* 11.9* 11.9*  HCT 38.1 33.9* 36.7 37.0  MCV 93.6 92.1 92.9 93.7  PLT 257 226 238 432   Basic Metabolic Panel: Recent Labs  Lab 01/20/17 1502 01/21/17 0127 01/22/17 0753 01/23/17 0633  NA 140 138 139 139  K  4.0 3.5 4.1 4.6  CL 102 103 103 102  CO2 _0 GLUCOSE 181* 185* 143* 125*  BUN 24* 20 21* 21*  CREATININE 1.34* 1.11* 1.24* 1.35*  CALCIUM 8.6* 8.2* 8.8* 8.7*   GFR: Estimated Creatinine Clearance: 26 mL/min (A) (by C-G formula  based on SCr of 1.35 mg/dL (H)). Liver Function Tests: Recent Labs  Lab 01/23/17 0633  AST 19  ALT 33  ALKPHOS 57  BILITOT 0.6  PROT 6.6  ALBUMIN 3.4*   No results for input(s): LIPASE, AMYLASE in the last 168 hours. No results for input(s): AMMONIA in the last 168 hours. Coagulation Profile: No results for input(s): INR, PROTIME in the last 168 hours. Cardiac Enzymes: Recent Labs  Lab 01/21/17 0127 01/21/17 0724 01/21/17 1340  TROPONINI 0.10* 0.09* 0.07*   BNP (last 3 results) No results for input(s): PROBNP in the last 8760 hours. HbA1C: Recent Labs    01/22/17 0753  HGBA1C 7.4*   CBG: Recent Labs  Lab 01/21/17 2144 01/22/17 0748 01/22/17 1200 01/22/17 2153 01/23/17 0720  GLUCAP 150* 132* 137* 144* 129*   Lipid Profile: No results for input(s): CHOL, HDL, LDLCALC, TRIG, CHOLHDL, LDLDIRECT in the last 72 hours. Thyroid Function Tests: No results for input(s): TSH, T4TOTAL, FREET4, T3FREE, THYROIDAB in the last 72 hours. Anemia Panel: Recent Labs    01/21/17 0724  VITAMINB12 2,564*  FOLATE 13.3  FERRITIN 34  TIBC 314  IRON 62  RETICCTPCT 1.7   Sepsis Labs: Recent Labs  Lab 01/22/17 1012  PROCALCITON <0.10    Recent Results (from the past 240 hour(s))  Respiratory Panel by PCR     Status: None   Collection Time: 01/22/17 10:05 AM  Result Value Ref Range Status   Adenovirus NOT DETECTED NOT DETECTED Final   Coronavirus 229E NOT DETECTED NOT DETECTED Final   Coronavirus HKU1 NOT DETECTED NOT DETECTED Final   Coronavirus NL63 NOT DETECTED NOT DETECTED Final   Coronavirus OC43 NOT DETECTED NOT DETECTED Final   Metapneumovirus NOT DETECTED NOT DETECTED Final   Rhinovirus / Enterovirus NOT DETECTED NOT DETECTED Final   Influenza A NOT DETECTED NOT DETECTED Final   Influenza B NOT DETECTED NOT DETECTED Final   Parainfluenza Virus 1 NOT DETECTED NOT DETECTED Final   Parainfluenza Virus 2 NOT DETECTED NOT DETECTED Final   Parainfluenza Virus 3 NOT  DETECTED NOT DETECTED Final   Parainfluenza Virus 4 NOT DETECTED NOT DETECTED Final   Respiratory Syncytial Virus NOT DETECTED NOT DETECTED Final   Bordetella pertussis NOT DETECTED NOT DETECTED Final   Chlamydophila pneumoniae NOT DETECTED NOT DETECTED Final   Mycoplasma pneumoniae NOT DETECTED NOT DETECTED Final    Comment: Performed at [facility], [place] [address] [place], [place] [address]         Radiology Studies: Ct Chest High Resolution  Result Date: 01/22/2017 CLINICAL DATA:  Inpatient. Follow-up interstitial lung disease. Insertional fatigue. Worsening hypoxemia. EXAM: CT CHEST WITHOUT CONTRAST TECHNIQUE: Multidetector CT imaging of the chest was performed following the standard protocol without intravenous contrast. High resolution imaging of the lungs, as well as inspiratory and expiratory imaging, was performed. COMPARISON:  01/20/2017 chest radiograph. 07/21/2016 high-resolution chest CT. FINDINGS: Motion degraded scan. Cardiovascular: Stable mild cardiomegaly. No significant pericardial fluid/thickening. Left main, left anterior descending, left circumflex and right coronary atherosclerosis. Atherosclerotic nonaneurysmal thoracic aorta. Normal caliber pulmonary arteries. Mediastinum/Nodes: No discrete thyroid nodules. Mildly patulous thoracic esophagus. Small fluid levels in the mid and lower thoracic  esophagus. No axillary adenopathy. Stable mildly enlarged 1.0 cm right paratracheal node (series 2/ image 45). Stable enlarged 1.4 cm AP window node (series 2/ image 44). No new pathologically enlarged mediastinal nodes. Mild bilateral hilar adenopathy appears stable and is poorly delineated on these noncontrast images. Lungs/Pleura: No pneumothorax. Trace dependent right pleural effusion. No left pleural effusion. No acute consolidative airspace disease, lung masses or significant pulmonary nodules. There is patchy confluent subpleural reticulation and ground-glass attenuation  with associated moderate traction bronchiectasis and architectural distortion throughout both lungs, with a strong basilar predominance. No frank honeycombing. No convincing interval progression since 07/21/2016 CT, although with clear progression compared to the 03/05/2014 CT. There is mild air trapping at the lung apices on the expiration sequence. There is new relatively uniform mild parenchymal ground-glass attenuation and interlobular septal thickening in both lungs. Upper abdomen: Stable subcentimeter hypodense liver lesion in the anterior left liver lobe, too small to characterize, probably benign. No acute abnormality in the upper abdomen. Musculoskeletal: No aggressive appearing focal osseous lesions. Marked thoracic spondylosis. IMPRESSION: 1. No convincing interval progression of basilar predominant fibrotic interstitial lung disease since 07/21/2016 high-resolution chest CT study, although with clear progression compared to the more remote 03/05/2014 CT study. Findings are most compatible with usual interstitial pneumonia (UIP) given the strong basilar predominance and progression. 2. New mild relatively uniform parenchymal ground-glass attenuation and interlobular septal thickening in the lungs, suggestive of a component of mild pulmonary edema due to heart failure given the cardiomegaly and new trace dependent right pleural effusion. 3. Mild air trapping at the lung apices, indicative of small airways disease. 4. Stable mild mediastinal and bilateral hilar lymphadenopathy, most compatible with benign reactive adenopathy. 5. Left main and 3 vessel coronary atherosclerosis. Aortic Atherosclerosis (ICD10-I70.0). Electronically Signed   By: Ilona Sorrel M.D.   On: 01/22/2017 12:46        Scheduled Meds: . amLODipine  5 mg Oral Daily  . enoxaparin (LOVENOX) injection  30 mg Subcutaneous Q24H  . escitalopram  20 mg Oral Daily  . furosemide  20 mg Oral Daily  . insulin aspart  0-9 Units  Subcutaneous TID WC  . lisinopril  20 mg Oral Daily  . LORazepam  1 mg Oral Daily  . methylPREDNISolone (SOLU-MEDROL) injection  80 mg Intravenous Q6H  . oxyCODONE  20 mg Oral TID   Continuous Infusions: . cefTRIAXone (ROCEPHIN)  IV 1 g (01/22/17 1733)     LOS: 2 days    Christine Pratt Tanna Furry, MD [facility] Pager [phone]  If 7PM-7AM, please contact night-coverage [email or web address] Password [email or web address] 01/23/2017, 11:21 AM

## 2017-01-23 NOTE — Progress Notes (Signed)
Daily Progress Note   Patient Name: Christine Pratt       Date: 01/23/2017 DOB: [birth year]  Age: 83 y.o. MRN#: [id number] Attending Physician: Rosita Fire, MD Primary Care Physician: Mayra Neer, MD Admit Date: 01/20/2017  Reason for Consultation/Follow-up: Establishing goals of care, Hospice Evaluation and Psychosocial/spiritual support  Subjective: Met with patient, patient's 2 daughters, son, 2 granddaughters, as well as son-in-law for goals of care meeting.  Chart reviewed.  Patient had CT of the chest.  Pulmonology at the bedside, reviewing CT results.  Discussed the patient is admitted with pulmonary fibrosis flare, no convincing signs of infection, small pleural effusions, recommendations for steroids.  Pulmonology discussed with family that usually after a flareup with pulmonary fibrosis, patient does not return to their previous baseline Introduce topics of CODE STATUS, defined in terms of full code and DO NOT RESUSCITATE; hospice support in the home  Length of Stay: 2  Current Medications: Scheduled Meds:  . amLODipine  5 mg Oral Daily  . enoxaparin (LOVENOX) injection  30 mg Subcutaneous Q24H  . escitalopram  20 mg Oral Daily  . furosemide  20 mg Oral Daily  . insulin aspart  0-9 Units Subcutaneous TID WC  . lisinopril  20 mg Oral Daily  . LORazepam  1 mg Oral Daily  . methylPREDNISolone (SOLU-MEDROL) injection  80 mg Intravenous Q6H  . oxyCODONE  20 mg Oral TID  . pantoprazole  40 mg Oral Daily    Continuous Infusions:   PRN Meds: acetaminophen **OR** acetaminophen, albuterol, nitroGLYCERIN, ondansetron **OR** ondansetron (ZOFRAN) IV, polyethylene glycol powder  Physical Exam  Constitutional: She is oriented to person, place, and time. She appears  well-developed and well-nourished.  Elderly female, lying in bed, no acute distress  HENT:  Head: Normocephalic and atraumatic.  Neck: Normal range of motion.  Cardiovascular: Normal rate.  Pulmonary/Chest:  Increased work of breathing with any movement  Musculoskeletal: Normal range of motion.  Neurological: She is alert and oriented to person, place, and time.  Skin: Skin is warm and dry. There is pallor.  Psychiatric: She has a normal mood and affect. Her behavior is normal. Judgment and thought content normal.  Short-term memory deficits  Nursing note and vitals reviewed.           Vital Signs: BP (!) 142/69 (BP  Location: Right Arm)   Pulse 71   Temp 97.9 F (36.6 C) (Oral)   Resp 18   Ht _0  (1.575 m)   Wt 70.3 kg (155 lb)   SpO2 100%   BMI 28.35 kg/m  SpO2: SpO2: 100 % O2 Device: O2 Device: Nasal Cannula O2 Flow Rate: O2 Flow Rate (L/min): 4 L/min  Intake/output summary: No intake or output data in the 24 hours ending 01/23/17 1315 LBM: Last BM Date: 01/17/17 Baseline Weight: Weight: 70.3 kg (155 lb) Most recent weight: Weight: 70.3 kg (155 lb)       Palliative Assessment/Data:    Flowsheet Rows     Most Recent Value  Intake Tab  Referral Department  Hospitalist  Unit at Time of Referral  Med/Surg Unit  Palliative Care Primary Diagnosis  Pulmonary  Date Notified  01/21/17  Reason for referral  Clarify Goals of Care  Date of Admission  01/18/17  Date first seen by Palliative Care  01/22/17  # of days Palliative referral response time  1 Day(s)  # of days IP prior to Palliative referral  3  Clinical Assessment  Palliative Performance Scale Score  40%  Pain Max last 24 hours  Not able to report  Pain Min Last 24 hours  Not able to report  Dyspnea Max Last 24 Hours  Not able to report  Dyspnea Min Last 24 hours  Not able to report  Nausea Max Last 24 Hours  Not able to report  Nausea Min Last 24 Hours  Not able to report  Anxiety Max Last 24 Hours  Not  able to report  Anxiety Min Last 24 Hours  Not able to report  Other Max Last 24 Hours  Not able to report  Psychosocial & Spiritual Assessment  Palliative Care Outcomes  Patient/Family meeting held?  Yes  Who was at the meeting?  granddaughter  Palliative Care follow-up planned  Yes, Facility      Patient Active Problem List   Diagnosis Date Noted  . Acute on chronic respiratory failure with hypoxemia ([place]) 01/22/2017  . IPF (idiopathic pulmonary fibrosis) ([place]) 01/22/2017  . Palliative care by specialist   . Pneumonia 01/20/2017  . Community acquired pneumonia of right middle lobe of lung ([place])   . Complicated urinary tract infection   . Leg pain, bilateral 12/02/2015  . Fatigue 11/17/2014  . Hyperlipidemia with target LDL less than 70 03/10/2014  . CAD S/P percutaneous coronary angioplasty   . Obesity (BMI 30-39.9) 01/07/2014  . Essential hypertension 01/07/2014  . DOE (dyspnea on exertion) 01/07/2014  . ILD (interstitial lung disease) ([place]) 11/29/2013  . Hypoxemia 11/29/2013    Palliative Care Assessment & Plan   Patient Profile: 83 y.o. female  with past medical history of pulmonary fibrosis ( DX in 2015), CAD s/p stent, CKD, DM, HTN, old CVA admitted on 01/20/2017 with increased shortness of breath, near syncope. Pt was found to have PNA as well as UTI, left foot 5th metatarsal fx  Consult for GOC.   Recommendations/Plan:  Patient and family have elected DNR DNI  Home with hospice when medically maximized.  Offered choice per Medicare guidelines, requirements.  Family elects hospice and palliative care of [facility].  Order placed to case management as well as hospice and palliative care of [place] the topic of opioids to manage end-stage dyspnea, risks and benefits  Portable DNR form filled out and is on patient's chart  Goals of Care and Additional  Recommendations:  Limitations on Scope of Treatment: Avoid Hospitalization,  Initiate Comfort Feeding, No Artificial Feeding, No Chemotherapy, No Hemodialysis, No Radiation, No Surgical Procedures and No Tracheostomy  Code Status:    Code Status Orders  (From admission, onward)        Start     Ordered   01/23/17 1112  Do not attempt resuscitation (DNR)  Continuous    Question Answer Comment  In the event of cardiac or respiratory ARREST Do not call a "code blue"   In the event of cardiac or respiratory ARREST Do not perform Intubation, CPR, defibrillation or ACLS   In the event of cardiac or respiratory ARREST Use medication by any route, position, wound care, and other measures to relive pain and suffering. May use oxygen, suction and manual treatment of airway obstruction as needed for comfort.      01/23/17 1111    Code Status History    Date Active Date Inactive Code Status Order ID Comments User Context   01/20/2017 21:46 01/23/2017 11:11 Full Code [id number]  Rise Patience, MD ED   01/16/2014 18:42 01/17/2014 15:58 Full Code [id number]  Leonie Man, MD Inpatient    Advance Directive Documentation     Most Recent Value  Type of Advance Directive  Healthcare Power of Attorney  Pre-existing out of facility DNR order (yellow form or pink MOST form)  No data  "MOST" Form in Place?  No data       Prognosis:   < 6 months in the setting of end-stage pulmonary fibrosis (diagnosed in 2015), difficult right-sided heart failure, chronic kidney disease stage III, coronary artery disease, old CVA  Discharge Planning:  Home with Hospice   Thank you for allowing the Palliative Medicine Team to assist in the care of this patient.   Time In: 1000 Time Out: 1100 Total Time 60 min Prolonged Time Billed  no       Greater than 50%  of this time was spent counseling and coordinating care related to the above assessment and plan.  Dory Horn, NP  Please contact Palliative Medicine Team phone at [phone] for questions and concerns.

## 2017-01-24 LAB — RENAL FUNCTION PANEL
ALBUMIN: 3.6 g/dL (ref 3.5–5.0)
ANION GAP: 11 (ref 5–15)
BUN: 28 mg/dL — ABNORMAL HIGH (ref 6–20)
CALCIUM: 9 mg/dL (ref 8.9–10.3)
CO2: 27 mmol/L (ref 22–32)
CREATININE: 1.22 mg/dL — AB (ref 0.44–1.00)
Chloride: 102 mmol/L (ref 101–111)
GFR calc non Af Amer: 38 mL/min — ABNORMAL LOW (ref >60)
GFR, EST AFRICAN AMERICAN: 44 mL/min — AB (ref >60)
Glucose, Bld: 204 mg/dL — ABNORMAL HIGH (ref 65–99)
PHOSPHORUS: 4.8 mg/dL — AB (ref 2.5–4.6)
Potassium: 4.3 mmol/L (ref 3.5–5.1)
SODIUM: 140 mmol/L (ref 135–145)

## 2017-01-24 LAB — GLUCOSE, CAPILLARY
GLUCOSE-CAPILLARY: 177 mg/dL — AB (ref 65–99)
GLUCOSE-CAPILLARY: 223 mg/dL — AB (ref 65–99)
Glucose-Capillary: 182 mg/dL — ABNORMAL HIGH (ref 65–99)
Glucose-Capillary: 240 mg/dL — ABNORMAL HIGH (ref 65–99)

## 2017-01-24 MED ORDER — METHYLPREDNISOLONE SODIUM SUCC 125 MG IJ SOLR
60.0000 mg | Freq: Four times a day (QID) | INTRAMUSCULAR | Status: DC
  Administered 2017-01-24 – 2017-01-25 (×3): 60 mg via INTRAVENOUS
  Filled 2017-01-24 (×3): qty 2

## 2017-01-24 NOTE — Progress Notes (Signed)
PROGRESS NOTE    Christine Pratt  LTJ:[id number] DOB: February 16, 1926 DOA: 01/20/2017 PCP: Mayra Neer, MD   Brief Narrative: 83 y.o. female with known history of pulmonary fibrosis, chronic hypoxemic respiratory failure on home O2, CAD status post PCI, brought to the hospital by family for evaluation of worsening shortness of breath, fatigue. Thought to have pneumonia on chest x-ray and admitted to the hospitalist service. Echocardiogram showed severe pulmonary hypertension-after discussion with pulmonology, palliative care consulted.  Assessment & Plan:   #Acute on chronic respiratory failure with hypoxia due to flare of interstitial lung disease.   -Evaluated by pulmonologist, CT scan consistent with interstitial lung disease flare.  Plan to continue IV Solu-Medrol today and possibly switching to oral prednisone.  No antibiotics.  Patient is afebrile, RSV negative and pro calcitonin level not elevated.  Currently on 4 L of oxygen which is her home dose.  Likely discharge home with hospice tomorrow.  Case manager was consulted. -Received a dose of IV Lasix yesterday for mild acute pulmonary edema.  Continue oral Lasix today.  #Interstitial pulmonary fibrosis flare alone on IV Solu-Medrol.  Pulmonary consult appreciated.  Continue bronchodilators and rescue therapy.  #Asymptomatic bacteriuria: Does not have urinary symptoms.  Discontinue IV abx.  #Severe pulmonary hypertension: Poor prognosis.  Palliative care following.  #History of coronary artery disease: No chest pain.  Continue current treatment.  #Essential hypertension: Continue amlodipine and lisinopril.  Monitor blood pressure.  #Type 2 diabetes: Blood sugar level acceptable.  Continue sliding scale.  #Chronic pain management: On oxycodone orally.  Continue.  #Chronic kidney disease stage III: Serum creatinine level around baseline.  Monitor BMP.   DVT prophylaxis: Lovenox subcutaneous Code Status: DNR Family Communication:  Patient's granddaughter at bedside. Disposition Plan: Admitted    Consultants:   Pulmonologist  Procedures: CT scan Antimicrobials: Azithromycin and ceftriaxone on admission  Subjective: Seen and examined at bedside.  Shortness of breath is stable.  No cough.  Denies headache, dizziness, nausea or vomiting.  Objective: Vitals:   01/22/17 1026 01/22/17 1400 01/22/17 2150 01/23/17 0502  BP: (!) 149/82 121/60 (!) 144/82 (!) 142/69  Pulse:  77 71 71  Resp:  _0 Temp:  98.3 F (36.8 C) 97.9 F (36.6 C) 97.9 F (36.6 C)  TempSrc:  Oral Oral Oral  SpO2:  96% 97% 100%  Weight:      Height:       No intake or output data in the 24 hours ending 01/23/17 1121 Filed Weights   01/20/17 2352  Weight: 70.3 kg (155 lb)    Examination:  General exam:  not in distress Respiratory system: B lateral diffuse crackles, no wheezing Cardiovascular system: Regular rate rhythm, S1-S2 normal.  No pedal edema. Gastrointestinal system: Abdomen is nondistended, soft and nontender. Normal bowel sounds heard. Central nervous system: Alert awake and following commands. Skin: No rashes, lesions or ulcers Psychiatry: Judgement and insight appear normal. Mood & affect appropriate.     Data Reviewed: I have personally reviewed following labs and imaging studies  CBC: Recent Labs  Lab 01/20/17 1502 01/21/17 0127 01/22/17 0753 01/23/17 0633  WBC 8.9 7.6 8.2 7.6  HGB 12.4 11.1* 11.9* 11.9*  HCT 38.1 33.9* 36.7 37.0  MCV 93.6 92.1 92.9 93.7  PLT 257 226 238 076   Basic Metabolic Panel: Recent Labs  Lab 01/20/17 1502 01/21/17 0127 01/22/17 0753 01/23/17 0633  NA 140 138 139 139  K 4.0 3.5 4.1 4.6  CL 102 103 103 102  CO2 _0 GLUCOSE 181* 185* 143* 125*  BUN 24* 20 21* 21*  CREATININE 1.34* 1.11* 1.24* 1.35*  CALCIUM 8.6* 8.2* 8.8* 8.7*   GFR: Estimated Creatinine Clearance: 26 mL/min (A) (by C-G formula based on SCr of 1.35 mg/dL (H)). Liver Function  Tests: Recent Labs  Lab 01/23/17 0633  AST 19  ALT 33  ALKPHOS 57  BILITOT 0.6  PROT 6.6  ALBUMIN 3.4*   No results for input(s): LIPASE, AMYLASE in the last 168 hours. No results for input(s): AMMONIA in the last 168 hours. Coagulation Profile: No results for input(s): INR, PROTIME in the last 168 hours. Cardiac Enzymes: Recent Labs  Lab 01/21/17 0127 01/21/17 0724 01/21/17 1340  TROPONINI 0.10* 0.09* 0.07*   BNP (last 3 results) No results for input(s): PROBNP in the last 8760 hours. HbA1C: Recent Labs    01/22/17 0753  HGBA1C 7.4*   CBG: Recent Labs  Lab 01/21/17 2144 01/22/17 0748 01/22/17 1200 01/22/17 2153 01/23/17 0720  GLUCAP 150* 132* 137* 144* 129*   Lipid Profile: No results for input(s): CHOL, HDL, LDLCALC, TRIG, CHOLHDL, LDLDIRECT in the last 72 hours. Thyroid Function Tests: No results for input(s): TSH, T4TOTAL, FREET4, T3FREE, THYROIDAB in the last 72 hours. Anemia Panel: Recent Labs    01/21/17 0724  VITAMINB12 2,564*  FOLATE 13.3  FERRITIN 34  TIBC 314  IRON 62  RETICCTPCT 1.7   Sepsis Labs: Recent Labs  Lab 01/22/17 1012  PROCALCITON <0.10    Recent Results (from the past 240 hour(s))  Respiratory Panel by PCR     Status: None   Collection Time: 01/22/17 10:05 AM  Result Value Ref Range Status   Adenovirus NOT DETECTED NOT DETECTED Final   Coronavirus 229E NOT DETECTED NOT DETECTED Final   Coronavirus HKU1 NOT DETECTED NOT DETECTED Final   Coronavirus NL63 NOT DETECTED NOT DETECTED Final   Coronavirus OC43 NOT DETECTED NOT DETECTED Final   Metapneumovirus NOT DETECTED NOT DETECTED Final   Rhinovirus / Enterovirus NOT DETECTED NOT DETECTED Final   Influenza A NOT DETECTED NOT DETECTED Final   Influenza B NOT DETECTED NOT DETECTED Final   Parainfluenza Virus 1 NOT DETECTED NOT DETECTED Final   Parainfluenza Virus 2 NOT DETECTED NOT DETECTED Final   Parainfluenza Virus 3 NOT DETECTED NOT DETECTED Final   Parainfluenza  Virus 4 NOT DETECTED NOT DETECTED Final   Respiratory Syncytial Virus NOT DETECTED NOT DETECTED Final   Bordetella pertussis NOT DETECTED NOT DETECTED Final   Chlamydophila pneumoniae NOT DETECTED NOT DETECTED Final   Mycoplasma pneumoniae NOT DETECTED NOT DETECTED Final    Comment: Performed at [facility], [place] [address], [place], [place] [address]         Radiology Studies: Ct Chest High Resolution  Result Date: 01/22/2017 CLINICAL DATA:  Inpatient. Follow-up interstitial lung disease. Insertional fatigue. Worsening hypoxemia. EXAM: CT CHEST WITHOUT CONTRAST TECHNIQUE: Multidetector CT imaging of the chest was performed following the standard protocol without intravenous contrast. High resolution imaging of the lungs, as well as inspiratory and expiratory imaging, was performed. COMPARISON:  01/20/2017 chest radiograph. 07/21/2016 high-resolution chest CT. FINDINGS: Motion degraded scan. Cardiovascular: Stable mild cardiomegaly. No significant pericardial fluid/thickening. Left main, left anterior descending, left circumflex and right coronary atherosclerosis. Atherosclerotic nonaneurysmal thoracic aorta. Normal caliber pulmonary arteries. Mediastinum/Nodes: No discrete thyroid nodules. Mildly patulous thoracic esophagus. Small fluid levels in the mid and lower thoracic esophagus. No axillary adenopathy. Stable mildly enlarged 1.0 cm right paratracheal  node (series 2/ image 59). Stable enlarged 1.4 cm AP window node (series 2/ image 44). No new pathologically enlarged mediastinal nodes. Mild bilateral hilar adenopathy appears stable and is poorly delineated on these noncontrast images. Lungs/Pleura: No pneumothorax. Trace dependent right pleural effusion. No left pleural effusion. No acute consolidative airspace disease, lung masses or significant pulmonary nodules. There is patchy confluent subpleural reticulation and ground-glass attenuation with associated moderate traction bronchiectasis  and architectural distortion throughout both lungs, with a strong basilar predominance. No frank honeycombing. No convincing interval progression since 07/21/2016 CT, although with clear progression compared to the 03/05/2014 CT. There is mild air trapping at the lung apices on the expiration sequence. There is new relatively uniform mild parenchymal ground-glass attenuation and interlobular septal thickening in both lungs. Upper abdomen: Stable subcentimeter hypodense liver lesion in the anterior left liver lobe, too small to characterize, probably benign. No acute abnormality in the upper abdomen. Musculoskeletal: No aggressive appearing focal osseous lesions. Marked thoracic spondylosis. IMPRESSION: 1. No convincing interval progression of basilar predominant fibrotic interstitial lung disease since 07/21/2016 high-resolution chest CT study, although with clear progression compared to the more remote 03/05/2014 CT study. Findings are most compatible with usual interstitial pneumonia (UIP) given the strong basilar predominance and progression. 2. New mild relatively uniform parenchymal ground-glass attenuation and interlobular septal thickening in the lungs, suggestive of a component of mild pulmonary edema due to heart failure given the cardiomegaly and new trace dependent right pleural effusion. 3. Mild air trapping at the lung apices, indicative of small airways disease. 4. Stable mild mediastinal and bilateral hilar lymphadenopathy, most compatible with benign reactive adenopathy. 5. Left main and 3 vessel coronary atherosclerosis. Aortic Atherosclerosis (ICD10-I70.0). Electronically Signed   By: Ilona Sorrel M.D.   On: 01/22/2017 12:46        Scheduled Meds: . amLODipine  5 mg Oral Daily  . enoxaparin (LOVENOX) injection  30 mg Subcutaneous Q24H  . escitalopram  20 mg Oral Daily  . furosemide  20 mg Oral Daily  . insulin aspart  0-9 Units Subcutaneous TID WC  . lisinopril  20 mg Oral Daily  .  LORazepam  1 mg Oral Daily  . methylPREDNISolone (SOLU-MEDROL) injection  80 mg Intravenous Q6H  . oxyCODONE  20 mg Oral TID   Continuous Infusions: . cefTRIAXone (ROCEPHIN)  IV 1 g (01/22/17 1733)     LOS: 2 days    Dron Tanna Furry, MD [facility] Pager [phone]  If 7PM-7AM, please contact night-coverage [email or web address] Password [email or web address] 01/23/2017, 11:21 AM

## 2017-01-24 NOTE — Consult Note (Signed)
PULMONARY / CRITICAL CARE MEDICINE   Name: Christine Pratt MRN: [id number] DOB: Aug 09, 1925 PCP Mayra Neer, MD LOS 3 as of 01/24/2017     ADMISSION DATE:  01/20/2017 CONSULTATION DATE:  01/24/2017  REFERRING MD:  ILD worsening   CHIEF COMPLAINT:  Worsening confusion, fatigue with exertion brief  History is obtained from the granddaughter and by review of the chart  As best as I can ascertain this patient has interstitial lung disease on his CT scans that I personally visualized dating back to 2010.  According to the granddaughter they are aware of interstitial lung disease/pulmonary fibrosis diagnosis since 2015 and have been following with Dr. Lamonte Sakai.  Since then she is also been on oxygen initially only at night but later on 24/7.  The granddaughter believes that the baseline oxygen use is around 4 L nasal cannula.  Granddaughter reports slow progressive steady decline in shortness of breath but still patient was able to ambulate and do activities of daily living without a problem.  However, in the last few to several weeks especially in the last 3 weeks has been a steady decline.  What is interesting is that the granddaughter describes exertional fatigue and confusional episodes associated with hypertension.  The symptoms are immediately relieved by rest.  This happened just walking short distances from the bed to the bathroom.  The granddaughter is not sure if it is associated with worsening dyspnea but she believes that the be the case.  At home they did not check for correlation of this and exertional hypoxemia.  Granddaughter denies any fever chills cough sputum or worsening pedal edema or chest pain.  Upon admission on January 20, 2017 CT head was negative.  In terms of her interstitial lung disease her CT pattern most recently high-resolution CT in May 2018 is 1 of classic UIP with bilateral bibasilar subpleural disease with basal predominance and symmetric and honeycombing.  Features  have definitely progressed between 2015 and 2018.  In 2015 her autoimmune profile was negative.  In terms of exposure history she denies any birds or mold or mildew.  However she does live in a house built in the 1930s and she might have feathered pillows.  She is not on any anti-fibrotic pulmonary regimen     events - ECHO 11/8 - RV failure with PASP 90s. . CT 11/9 - UIP with mild ggo and mild/small pleural effusion per radiology. Per my viscualiatio - UIP is same . GGO quite prominent esp in UL but agree with air trapping. Not sue of effusions. RVP negative. Urine strep negative. And PCT < 0.1   SUBJECTIVE/OVERNIGHT/INTERVAL HX 01/24/2017 - plans for home with home hospice. Steroids causing hyperglcemia. Patient and grandaughter want patient to sit in chair and ambulate to extent possible   VITAL SIGNS: BP 134/66 (BP Location: Left Arm)   Pulse (!) 109   Temp 97.8 F (36.6 C) (Oral)   Resp 19   Ht _0  (1.575 m)   Wt 70.3 kg (155 lb)   SpO2 99%   BMI 28.35 kg/m   HEMODYNAMICS:    VENTILATOR SETTINGS:    INTAKE / OUTPUT: I/O last 3 completed shifts: In: 480 [P.O.:480] Out: -      EXAM    General Appearance:    Looks better  Head:    Normocephalic, without obvious abnormality, atraumatic  Eyes:    PERRL - yes, conjunctiva/corneas - clear      Ears:    Normal external ear canals, both  ears  Nose:   NG tube - no  Throat:  ETT TUBE - no , OG tube - no  Neck:   Supple,  No enlargement/tenderness/nodules     Lungs:     Clear to auscultation bilaterall but base has crackles  Chest wall:    No deformity  Heart:    S1 and S2 normal, no murmur, CVP - no.  Pressors - no  Abdomen:     Soft, no masses, no organomegaly  Genitalia:    Not done  Rectal:   not done  Extremities:   Extremities- no edema     Skin:   Intact in exposed areas . Sacral area - no decub     Neurologic:   Sedation - none -> RASS - 0 . Moves all 4s - yes. CAM-ICU - neg for delirum . Orientation -  x 3 +        LABS  PULMONARY No results for input(s): PHART, PCO2ART, PO2ART, HCO3, TCO2, O2SAT in the last 168 hours.  Invalid input(s): PCO2, PO2  CBC Recent Labs  Lab 01/21/17 0127 01/22/17 0753 01/23/17 0633  HGB 11.1* 11.9* 11.9*  HCT 33.9* 36.7 37.0  WBC 7.6 8.2 7.6  PLT 226 238 245    COAGULATION No results for input(s): INR in the last 168 hours.  CARDIAC   Recent Labs  Lab 01/21/17 0127 01/21/17 0724 01/21/17 1340  TROPONINI 0.10* 0.09* 0.07*   No results for input(s): PROBNP in the last 168 hours.   CHEMISTRY Recent Labs  Lab 01/20/17 1502 01/21/17 0127 01/22/17 0753 01/23/17 0633 01/24/17 0549  NA 140 138 139 139 140  K 4.0 3.5 4.1 4.6 4.3  CL 102 103 103 102 102  CO2 _0 GLUCOSE 181* 185* 143* 125* 204*  BUN 24* 20 21* 21* 28*  CREATININE 1.34* 1.11* 1.24* 1.35* 1.22*  CALCIUM 8.6* 8.2* 8.8* 8.7* 9.0  PHOS  --   --   --   --  4.8*   Estimated Creatinine Clearance: 28.7 mL/min (A) (by C-G formula based on SCr of 1.22 mg/dL (H)).   LIVER Recent Labs  Lab 01/23/17 0633 01/24/17 0549  AST 19  --   ALT 33  --   ALKPHOS 57  --   BILITOT 0.6  --   PROT 6.6  --   ALBUMIN 3.4* 3.6     INFECTIOUS Recent Labs  Lab 01/22/17 1012  PROCALCITON <0.10     ENDOCRINE CBG (last 3)  Recent Labs    01/23/17 2127 01/24/17 0744 01/24/17 1135  GLUCAP 182* 177* 223*         IMAGING x48h  - image(s) personally visualized  -   highlighted in bold No results found.     ASSESSMENT and PLAN  Acute on chronic respiratory failure with hypoxemia (HCC) The story of exertional confusion and fatigue immediately relieved by rest although a little bit atypical is consistent with worsening hypoxemia seen in pulmonary fibrosis patient's the deterioration over the last 3 weeks suggest that this is.  IPF flare up   plan -High-resolution CT chest  -Respiratory virus panel -Pro-calcitonin protocol -Titrate oxygen to keep  pulse ox greater than 88% [currently needing 4 L nasal cannula]    IPF (idiopathic pulmonary fibrosis) ([place]) I believe the primary diagnosis is idiopathic pulmonary fibrosis.  This is based on classic UIP pattern on CT scan, progression, age greater than 66 and negative autoimmune profile.  The only differential  diagnosis would be chronic hypersensitivity pneumonitis which can mimic this because of the history of she having feathered pillows but I believe the possibility of this is low  Currently it is likely that she has IPF flareup  Plan -Get high-resolution CT as described above -Clearly if her fibrosis is getting worse and she has IPF flareup suggestion on the CT scan then we can do 3 weeks of steroids -Also the suggestion of IPF flare up on the CT scan, it is to be noted that the 69-monthmortality after an admission for  IPF flareup is extremely high and therefore palliative support would be appropriate     Acute on chronic respiratory failure with hypoxemia ([place] Features quite c/w IPF flare with GGO on CT . No evidence of infection - PCT negative, RVP negative, Afebrile, WBC normal, . There is question of UTI but hx not wc/w UTI,  WBC/RBc within normal limits on UA. Culture pending v not sent  Plan IV sterpids x since 11/10 through 11/12 and then po steroids  2-3 weeks - d/w family Reduce IV steroid dose 01/24/2017 due to hyoperglycemia   IPF (idiopathic pulmonary fibrosis) ([place] This is IPF.  Lonng disucssion with multiple family members (son, daughters x 2, d-il, and grandaughter x 2). On 01/23/17  Plan 4L [place] No anti-fibrotics Agree with hospce at home but still survival expected to be few to several month During this time, family counseled for aptient to be as active as she can Will test her pulse ox on stting at 4L [place] and walking in hallway 01/24/2017     NEEDS OPD PULM FOLLOWUP  - [place]   FAMILY  - Updates: 01/24/2017 --> updated granddaughter at the bedside.    - Inter-disciplinary family meet or Palliative Care meeting due by:  DAy 7. Current LOS is LOS 3 days  CODE STATUS    Code Status Orders  (From admission, onward)        Start     Ordered   01/20/17 2146  Full code  Continuous     01/20/17 2146    Code Status History    Date Active Date Inactive Code Status Order ID Comments User Context   01/16/2014 18:42 01/17/2014 15:58 Full Code [id number] HLeonie Man MD Inpatient    Advance Directive Documentation     Most Recent Value  Type of Advance Directive  Healthcare Power of Attorney  Pre-existing out of facility DNR order (yellow form or pink MOST form)  No data  "MOST" Form in Place?  No data        DISPO Keep in medical floor   Dr. MBrand Males M.D., FMayo Clinic Health System Eau Claire HospitalC.P Pulmonary and Critical Care Medicine Staff Physician, CPetersburg Medical CenterDirector - Interstitial Lung Disease  [place]ulmonary [place] [id number] Pager: [phone] If no answer or between  15:00h - 7:00h: call [phone] Telephone: [phone]

## 2017-01-24 NOTE — Progress Notes (Addendum)
Patients oxygen level was 94% on 4 liters via nasal cannula and  HR 106 prior to ambulation. Patient ambulated in hallway a short distance ( from room 1510 to window in hallway) oxygen level dropped to 85 % on 4L. HR increased to 122. Oxygen level remained between 89-85% while ambulating.

## 2017-01-25 LAB — GLUCOSE, CAPILLARY
GLUCOSE-CAPILLARY: 272 mg/dL — AB (ref 65–99)
GLUCOSE-CAPILLARY: 243 mg/dL — AB (ref 65–99)
Glucose-Capillary: 238 mg/dL — ABNORMAL HIGH (ref 65–99)

## 2017-01-25 MED ORDER — PREDNISONE 10 MG PO TABS: 10.0000 mg | ORAL_TABLET | Freq: Every day | ORAL | 0 refills | Status: AC

## 2017-01-25 MED ORDER — LORAZEPAM 1 MG PO TABS: 1.0000 mg | ORAL_TABLET | Freq: Every day | ORAL | 0 refills | Status: AC

## 2017-01-25 MED ORDER — OXYCODONE HCL 20 MG PO TABS: 20.0000 mg | ORAL_TABLET | Freq: Three times a day (TID) | ORAL | 0 refills | Status: AC

## 2017-01-25 MED ORDER — GLIMEPIRIDE 1 MG PO TABS: 1.0000 mg | ORAL_TABLET | ORAL | 0 refills | Status: AC

## 2017-01-25 MED ORDER — ACETAMINOPHEN 325 MG PO TABS: 650.0000 mg | ORAL_TABLET | Freq: Four times a day (QID) | ORAL | Status: AC | PRN

## 2017-01-25 NOTE — Discharge Summary (Signed)
Physician Discharge Summary  Christine Pratt RXV:[id number] DOB: Mar 19, 1925 DOA: 01/20/2017  PCP: Mayra Neer, MD  Admit date: 01/20/2017 Discharge date: 01/25/2017  Admitted From:home Disposition:home with hospice  Recommendations for Outpatient Follow-up:  1. Follow up with PCP in 1-2 weeks  Home Health:home hospice Equipment/Devices:hospice Discharge Condition:hospice CODE STATUS:dnr Diet recommendation:regular  Brief/Interim Summary: 83 y.o.female with known history of pulmonary fibrosis, chronic hypoxemic respiratory failure on home O2, CAD status post PCI, brought to the hospital by family for evaluation of worsening shortness of breath, fatigue.Thought to have pneumonia on chest x-ray and admitted to the hospitalist service. Echocardiogram showed severe pulmonary hypertension-after discussion with pulmonology, palliative care consulted.  #Acute on chronic respiratory failure with hypoxia due to flare of interstitial lung disease.   -Evaluated by pulmonologist, CT scan consistent with interstitial lung disease flare. Received IV Solu-Medrol with clinical improvement.  As per pulmonologist plan to switch to oral prednisone for next to 3 weeks.  Prescription provided.  Patient is clinically improved.  Oxygen saturation acceptable on 4 L of oxygen which is her home dose.  Pro-calcitonin level not elevated, RSV negative, no leukocytosis.  Patient is off antibiotics.    #Interstitial pulmonary fibrosis flarel.  Continue bronchodilators and oral prednisone.  Evaluated by pulmonologist and palliative care.  Patient with worsening lung condition.  Patient is now DNR/DNI and going home with hospice care.  #Asymptomatic bacteriuria: Does not have urinary symptoms.  Discontinued IV abx.  #Severe pulmonary hypertension: Poor prognosis.  Home hospice  #History of coronary artery disease: No chest pain.  Continue current treatment.  #Essential hypertension:  Monitor blood  pressure.  #Type 2 diabetes with hyperglycemia in the setting of steroid use.  Ordered glimepiride 1 mg daily until patient is on a steroid.  Recommend to follow-up with PCP.  I have discussed with the patient's daughter and granddaughter at bedside regarding diabetes and blood sugar management while on steroids.  Recommended not controlling sugar tightly.  #Chronic pain management: On oxycodone orally.  Continue.  #Chronic kidney disease stage III: Serum creatinine level around baseline.    Patient is clinically stable to transfer care to home with hospice.  Recommend to follow-up with PCP and pulmonologist.  Discussed with the patient's daughter and granddaughter at bedside.   Discharge Diagnoses:  Principal Problem:   Acute on chronic respiratory failure with hypoxemia (HCC) Active Problems:   Pneumonia   Palliative care by specialist   IPF (idiopathic pulmonary fibrosis) [facility])    Discharge Instructions  Discharge Instructions    Call MD for:  difficulty breathing, headache or visual disturbances   Complete by:  As directed    Call MD for:  extreme fatigue   Complete by:  As directed    Call MD for:  hives   Complete by:  As directed    Call MD for:  persistant dizziness or light-headedness   Complete by:  As directed    Call MD for:  persistant nausea and vomiting   Complete by:  As directed    Call MD for:  severe uncontrolled pain   Complete by:  As directed    Call MD for:  temperature >100.4   Complete by:  As directed    Diet general   Complete by:  As directed    Increase activity slowly   Complete by:  As directed      Allergies as of 01/25/2017   No Known Allergies     Medication List    STOP taking  these medications   atorvastatin 20 MG tablet Commonly known as:  LIPITOR     TAKE these medications   acetaminophen 325 MG tablet Commonly known as:  TYLENOL Take 2 tablets (650 mg total) every 6 (six) hours as needed by mouth for mild pain (or  Fever >/= 101).   VENTOLIN HFA 108 (90 Base) MCG/ACT inhaler Generic drug:  albuterol Inhale 2 puffs daily as needed into the lungs for wheezing or shortness of breath.   albuterol (2.5 MG/3ML) 0.083% nebulizer solution Commonly known as:  PROVENTIL Take 2.5 mg by nebulization every 6 (six) hours as needed.   amLODipine 5 MG tablet Commonly known as:  NORVASC take 1 tablet by mouth once daily   escitalopram 20 MG tablet Commonly known as:  LEXAPRO Take 20 mg by mouth daily.   furosemide 20 MG tablet Commonly known as:  LASIX Take 20 mg by mouth daily.   glimepiride 1 MG tablet Commonly known as:  AMARYL Take 1 tablet (1 mg total) every morning for 21 days by mouth.   LORazepam 1 MG tablet Commonly known as:  ATIVAN Take 1 tablet (1 mg total) daily by mouth.   MIRALAX PO Take 17 g by mouth daily as needed (for constipation).   nitroGLYCERIN 0.4 MG SL tablet Commonly known as:  NITROSTAT Place 1 tablet (0.4 mg total) under the tongue every 5 (five) minutes as needed for chest pain.   Oxycodone HCl 20 MG Tabs Take 1 tablet (20 mg total) 3 (three) times daily by mouth.   predniSONE 10 MG tablet Commonly known as:  DELTASONE Take 1 tablet (10 mg total) daily with breakfast by mouth. Take 30 mg daily for one week 20 mg for one week 10 mg for one week and follow up with your pulmonologist.   quinapril 20 MG tablet Commonly known as:  ACCUPRIL Take 1 tablet by mouth daily.   VITAMIN B 12 PO Take 1 tablet daily by mouth.      Follow-up Information    Mayra Neer, MD. Schedule an appointment as soon as possible for a visit in 1 week(s).   Specialty:  Family Medicine Contact information: [address] [place] [address] [phone]        Collene Gobble, MD. Schedule an appointment as soon as possible for a visit in 1 week(s).   Specialty:  Pulmonary Disease Contact information: [address]  [address] [phone]          No Known Allergies  Consultations: Pulmonologist Palliative care  Procedures/Studies: None  Subjective: Seen and examined at bedside.  Denies headache, dizziness, nausea vomiting chest pain shortness of breath.  She has generalized weakness.  Discharge Exam: Vitals:   01/25/17 0520 01/25/17 0750  BP: 109/64   Pulse: 77   Resp: 18   Temp: 97.6 F (36.4 C)   SpO2: 90% 98%   Vitals:   01/24/17 1341 01/24/17 2019 01/25/17 0520 01/25/17 0750  BP: 134/66 130/81 109/64   Pulse: (!) 109 91 77   Resp: _0 Temp: 97.8 F (36.6 C) 97.8 F (36.6 C) 97.6 F (36.4 C)   TempSrc: Oral Oral Oral   SpO2: 99% 99% 90% 98%  Weight:      Height:        General: Pt is alert, awake, not in acute distress Cardiovascular: RRR, S1/S2 +, no rubs, no gallops Respiratory: Bilateral diffuse coarse crackles which is chronic, no wheezing  Abdominal: Soft, NT, ND, bowel sounds + Extremities: no edema, no cyanosis Alert, awake and following commands.   The results of significant diagnostics from this hospitalization (including imaging, microbiology, ancillary and laboratory) are listed below for reference.     Microbiology: Recent Results (from the past 240 hour(s))  Respiratory Panel by PCR     Status: None   Collection Time: 01/22/17 10:05 AM  Result Value Ref Range Status   Adenovirus NOT DETECTED NOT DETECTED Final   Coronavirus 229E NOT DETECTED NOT DETECTED Final   Coronavirus HKU1 NOT DETECTED NOT DETECTED Final   Coronavirus NL63 NOT DETECTED NOT DETECTED Final   Coronavirus OC43 NOT DETECTED NOT DETECTED Final   Metapneumovirus NOT DETECTED NOT DETECTED Final   Rhinovirus / Enterovirus NOT DETECTED NOT DETECTED Final   Influenza A NOT DETECTED NOT DETECTED Final   Influenza B NOT DETECTED NOT DETECTED Final   Parainfluenza Virus 1 NOT DETECTED NOT DETECTED Final   Parainfluenza Virus 2 NOT DETECTED NOT DETECTED Final   Parainfluenza Virus  3 NOT DETECTED NOT DETECTED Final   Parainfluenza Virus 4 NOT DETECTED NOT DETECTED Final   Respiratory Syncytial Virus NOT DETECTED NOT DETECTED Final   Bordetella pertussis NOT DETECTED NOT DETECTED Final   Chlamydophila pneumoniae NOT DETECTED NOT DETECTED Final   Mycoplasma pneumoniae NOT DETECTED NOT DETECTED Final    Comment: Performed at [facility], [address] [place], [place] [address]     Labs: BNP (last 3 results) Recent Labs    01/23/17 0633  BNP 1,914.7*   Basic Metabolic Panel: Recent Labs  Lab 01/20/17 1502 01/21/17 0127 01/22/17 0753 01/23/17 0633 01/24/17 0549  NA 140 138 139 139 140  K 4.0 3.5 4.1 4.6 4.3  CL 102 103 103 102 102  CO2 _0 GLUCOSE 181* 185* 143* 125* 204*  BUN 24* 20 21* 21* 28*  CREATININE 1.34* 1.11* 1.24* 1.35* 1.22*  CALCIUM 8.6* 8.2* 8.8* 8.7* 9.0  PHOS  --   --   --   --  4.8*   Liver Function Tests: Recent Labs  Lab 01/23/17 0633 01/24/17 0549  AST 19  --   ALT 33  --   ALKPHOS 57  --   BILITOT 0.6  --   PROT 6.6  --   ALBUMIN 3.4* 3.6   No results for input(s): LIPASE, AMYLASE in the last 168 hours. No results for input(s): AMMONIA in the last 168 hours. CBC: Recent Labs  Lab 01/20/17 1502 01/21/17 0127 01/22/17 0753 01/23/17 0633  WBC 8.9 7.6 8.2 7.6  HGB 12.4 11.1* 11.9* 11.9*  HCT 38.1 33.9* 36.7 37.0  MCV 93.6 92.1 92.9 93.7  PLT 257 226 238 245   Cardiac Enzymes: Recent Labs  Lab 01/21/17 0127 01/21/17 0724 01/21/17 1340  TROPONINI 0.10* 0.09* 0.07*   BNP: Invalid input(s): POCBNP CBG: Recent Labs  Lab 01/24/17 0744 01/24/17 1135 01/24/17 1713 01/24/17 2016 01/25/17 0737  GLUCAP 177* 223* 240* 272* 243*   D-Dimer No results for input(s): DDIMER in the last 72 hours. Hgb A1c No results for input(s): HGBA1C in the last 72 hours. Lipid Profile No results for input(s): CHOL, HDL, LDLCALC, TRIG, CHOLHDL, LDLDIRECT in the last 72 hours. Thyroid function studies No  results for input(s): TSH, T4TOTAL, T3FREE, THYROIDAB in the last 72 hours.  Invalid input(s): FREET3 Anemia work up No results for input(s): VITAMINB12, FOLATE, FERRITIN, TIBC, IRON, RETICCTPCT in the last 72 hours. Urinalysis  Component Value Date/Time   COLORURINE YELLOW 01/20/2017 1723   APPEARANCEUR HAZY (A) 01/20/2017 1723   LABSPEC 1.008 01/20/2017 1723   PHURINE 6.0 01/20/2017 1723   GLUCOSEU NEGATIVE 01/20/2017 1723   HGBUR NEGATIVE 01/20/2017 1723   BILIRUBINUR NEGATIVE 01/20/2017 1723   KETONESUR NEGATIVE 01/20/2017 1723   PROTEINUR NEGATIVE 01/20/2017 1723   NITRITE POSITIVE (A) 01/20/2017 1723   LEUKOCYTESUR TRACE (A) 01/20/2017 1723   Sepsis Labs Invalid input(s): PROCALCITONIN,  WBC,  LACTICIDVEN Microbiology Recent Results (from the past 240 hour(s))  Respiratory Panel by PCR     Status: None   Collection Time: 01/22/17 10:05 AM  Result Value Ref Range Status   Adenovirus NOT DETECTED NOT DETECTED Final   Coronavirus 229E NOT DETECTED NOT DETECTED Final   Coronavirus HKU1 NOT DETECTED NOT DETECTED Final   Coronavirus NL63 NOT DETECTED NOT DETECTED Final   Coronavirus OC43 NOT DETECTED NOT DETECTED Final   Metapneumovirus NOT DETECTED NOT DETECTED Final   Rhinovirus / Enterovirus NOT DETECTED NOT DETECTED Final   Influenza A NOT DETECTED NOT DETECTED Final   Influenza B NOT DETECTED NOT DETECTED Final   Parainfluenza Virus 1 NOT DETECTED NOT DETECTED Final   Parainfluenza Virus 2 NOT DETECTED NOT DETECTED Final   Parainfluenza Virus 3 NOT DETECTED NOT DETECTED Final   Parainfluenza Virus 4 NOT DETECTED NOT DETECTED Final   Respiratory Syncytial Virus NOT DETECTED NOT DETECTED Final   Bordetella pertussis NOT DETECTED NOT DETECTED Final   Chlamydophila pneumoniae NOT DETECTED NOT DETECTED Final   Mycoplasma pneumoniae NOT DETECTED NOT DETECTED Final    Comment: Performed at [facility], [place] [address] [place], [place] [address]     Time  coordinating discharge: 32 minutes  SIGNED:   Rosita Fire, MD  [facility] 01/25/2017, 11:16 AM  If 7PM-7AM, please contact night-coverage [email or web address] Password [email or web address]

## 2017-01-25 NOTE — Progress Notes (Signed)
[id number]/MEQAST [phone]/tct-Christine Pratt, with [facility]-patient perfers this agency-will see patient/home needs o2 is already in the home/bedside commode/Christine informed of this.

## 2017-01-25 NOTE — Progress Notes (Signed)
[facility]--Hospital Liaison RN Visit  Notified by Velva Harman, RNCM, of patient and family request for [facility] services at home after discharge. Chart and patient information under review by [facility] physician. Hospice eligibility pending at this time.  Spoke with patient, daughter Dorris Singh and granddaughter at bedside to initiate education related to hospice philosophy, services and team approach to care. Patient/family verbalized understanding. Per discussion, plan is for discharge home by [facility] on Monday 01/24/17.  DME needs have been discussed, patient currently has an O2 concentrator in the home with a home fill unit which she understands will be traded out for a concentrator and tanks. Additional DME requested is a BSC, transfer chair, over the bed table, and a transfer shower bench. O2 is currently at 4 lpm. [facility] equipment manager has been notified and will contact [place] to arrange delivery to the home. Home address has been verified and is correct in the chart. Nanci is the family member to contact to arrange time of delivery at [phone], [phone] or [phone].  Please send signed and completed DNR form home with patient/family. Patient will need prescriptions for discharge comfort medications.  [facility] Referral Center aware of the above. Completed discharge summary will need to be faxed to [facility] at [phone] when final. Please notify [facility] when patient is ready to leave the unit at discharge at [phone] or [phone] after 5 pm. [facility] information and contact numbers given to patient and daughter during visit.   Above information shared with Velva Harman, RNCM.  Please call with any hospice related questions or concerns.  Thank you for this referral.  Margaretmary Eddy, RN, BSN [facility] Liaison [phone]  [place] are on AMION.

## 2017-01-25 NOTE — Progress Notes (Signed)
[facility] called to transport patient home. IV removed, tele removed. DC instructions reviewed and prescriptions provided to patient, daughter, and granddaughter. Patient and family expressed understanding.

## 2017-03-16 DEATH — deceased

## 2018-04-19 IMAGING — NM NM MISC PROCEDURE
6 series · 36 of 36 positions shown · non-contrast
Comparison: none

[Series 1: wbr rest · 6.40mm/px · 6 of 64 frames shown]
[frame 6/64]
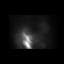
[frame 16/64]
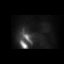
[frame 27/64]
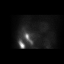
[frame 38/64]
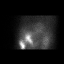
[frame 48/64]
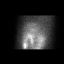
[frame 59/64]
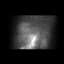

[Series 1: wbr_r-proj_st wbr rest · 6.40mm/px · 6 of 64 frames shown]
[frame 6/64]
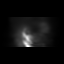
[frame 16/64]
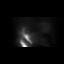
[frame 27/64]
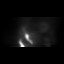
[frame 38/64]
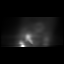
[frame 48/64]
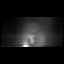
[frame 59/64]
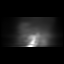

[Series 2: wbr_s-proj_st wbr stress-gsp · 6.40mm/px · 6 of 512 frames shown]
[frame 43/512]
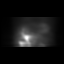
[frame 128/512]
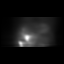
[frame 214/512]
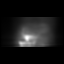
[frame 299/512]
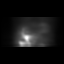
[frame 384/512]
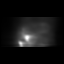
[frame 470/512]
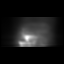

[Series 2: wbr stress-gsp · 6.40mm/px · 6 of 512 frames shown]
[frame 43/512]
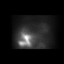
[frame 128/512]
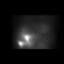
[frame 214/512]
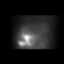
[frame 299/512]
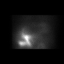
[frame 384/512]
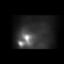
[frame 470/512]
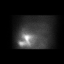

[Series 3: wbr_s-proj_st wbr stress-sum-em · 6.40mm/px · 6 of 64 frames shown]
[frame 6/64]
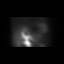
[frame 16/64]
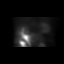
[frame 27/64]
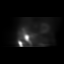
[frame 38/64]
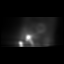
[frame 48/64]
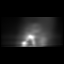
[frame 59/64]
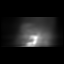

[Series 3: wbr stress-sum-em · 6.40mm/px · 6 of 64 frames shown]
[frame 6/64]
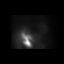
[frame 16/64]
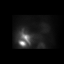
[frame 27/64]
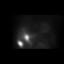
[frame 38/64]
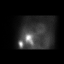
[frame 48/64]
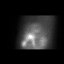
[frame 59/64]
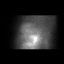

[36 of 36 positions shown; findings below may reference images not displayed]

Canned report from images found in remote index.

Refer to host system for actual result text.
